# Patient Record
Sex: Female | Born: 1965 | Race: White | Hispanic: No | Marital: Married | Smoking: Current every day smoker
Health system: Southern US, Community
[De-identification: ages and names within clinical notes are randomized; demographics above are authoritative.]

## PROBLEM LIST (undated history)

## (undated) DIAGNOSIS — K589 Irritable bowel syndrome without diarrhea: Secondary | ICD-10-CM

## (undated) DIAGNOSIS — K5792 Diverticulitis of intestine, part unspecified, without perforation or abscess without bleeding: Secondary | ICD-10-CM

## (undated) DIAGNOSIS — K219 Gastro-esophageal reflux disease without esophagitis: Secondary | ICD-10-CM

## (undated) DIAGNOSIS — M1990 Unspecified osteoarthritis, unspecified site: Secondary | ICD-10-CM

## (undated) HISTORY — PX: SHOULDER SURGERY: SHX246

## (undated) HISTORY — DX: Diverticulitis of intestine, part unspecified, without perforation or abscess without bleeding: K57.92

## (undated) HISTORY — PX: FOOT SURGERY: SHX648

## (undated) HISTORY — PX: CHOLECYSTECTOMY: SHX55

## (undated) HISTORY — PX: PARTIAL HYSTERECTOMY: SHX80

## (undated) HISTORY — DX: Irritable bowel syndrome, unspecified: K58.9

## (undated) HISTORY — PX: TUBAL LIGATION: SHX77

## (undated) HISTORY — DX: Gastro-esophageal reflux disease without esophagitis: K21.9

---

## 2002-09-17 ENCOUNTER — Encounter: Payer: Self-pay | Admitting: *Deleted

## 2002-09-17 ENCOUNTER — Emergency Department (HOSPITAL_COMMUNITY): Admission: EM | Admit: 2002-09-17 | Discharge: 2002-09-17 | Payer: Self-pay | Admitting: *Deleted

## 2006-03-17 HISTORY — PX: COLONOSCOPY: SHX174

## 2006-03-27 ENCOUNTER — Ambulatory Visit: Payer: Self-pay | Admitting: Internal Medicine

## 2006-04-11 ENCOUNTER — Ambulatory Visit (HOSPITAL_COMMUNITY): Admission: RE | Admit: 2006-04-11 | Discharge: 2006-04-11 | Payer: Self-pay | Admitting: Internal Medicine

## 2006-04-11 ENCOUNTER — Ambulatory Visit: Payer: Self-pay | Admitting: Internal Medicine

## 2006-07-19 ENCOUNTER — Ambulatory Visit: Payer: Self-pay | Admitting: Internal Medicine

## 2006-08-09 ENCOUNTER — Ambulatory Visit (HOSPITAL_COMMUNITY): Admission: RE | Admit: 2006-08-09 | Discharge: 2006-08-09 | Payer: Self-pay | Admitting: Internal Medicine

## 2006-08-19 ENCOUNTER — Ambulatory Visit: Payer: Self-pay | Admitting: Internal Medicine

## 2006-09-24 ENCOUNTER — Ambulatory Visit: Payer: Self-pay | Admitting: Internal Medicine

## 2006-09-25 ENCOUNTER — Ambulatory Visit (HOSPITAL_COMMUNITY): Admission: RE | Admit: 2006-09-25 | Discharge: 2006-09-25 | Payer: Self-pay | Admitting: Internal Medicine

## 2006-11-19 ENCOUNTER — Ambulatory Visit: Payer: Self-pay | Admitting: Internal Medicine

## 2006-12-17 ENCOUNTER — Ambulatory Visit: Payer: Self-pay | Admitting: Internal Medicine

## 2006-12-25 ENCOUNTER — Ambulatory Visit: Payer: Self-pay | Admitting: Internal Medicine

## 2007-01-07 ENCOUNTER — Ambulatory Visit: Payer: Self-pay | Admitting: Internal Medicine

## 2008-03-29 ENCOUNTER — Encounter: Payer: Self-pay | Admitting: Orthopedic Surgery

## 2008-03-30 ENCOUNTER — Telehealth: Payer: Self-pay | Admitting: Orthopedic Surgery

## 2008-04-03 ENCOUNTER — Emergency Department (HOSPITAL_COMMUNITY): Admission: EM | Admit: 2008-04-03 | Discharge: 2008-04-04 | Payer: Self-pay | Admitting: Emergency Medicine

## 2008-04-05 ENCOUNTER — Encounter: Payer: Self-pay | Admitting: Orthopedic Surgery

## 2008-06-01 DIAGNOSIS — N83209 Unspecified ovarian cyst, unspecified side: Secondary | ICD-10-CM

## 2008-06-01 DIAGNOSIS — Z8719 Personal history of other diseases of the digestive system: Secondary | ICD-10-CM

## 2008-06-01 DIAGNOSIS — K589 Irritable bowel syndrome without diarrhea: Secondary | ICD-10-CM

## 2008-06-01 DIAGNOSIS — Z87898 Personal history of other specified conditions: Secondary | ICD-10-CM

## 2008-06-01 DIAGNOSIS — F341 Dysthymic disorder: Secondary | ICD-10-CM

## 2009-11-29 ENCOUNTER — Emergency Department (HOSPITAL_COMMUNITY): Admission: EM | Admit: 2009-11-29 | Discharge: 2009-11-29 | Payer: Self-pay | Admitting: Emergency Medicine

## 2009-12-07 ENCOUNTER — Encounter: Payer: Self-pay | Admitting: Physician Assistant

## 2009-12-07 ENCOUNTER — Ambulatory Visit: Payer: Self-pay | Admitting: Family Medicine

## 2009-12-07 DIAGNOSIS — R1013 Epigastric pain: Secondary | ICD-10-CM

## 2009-12-07 DIAGNOSIS — R071 Chest pain on breathing: Secondary | ICD-10-CM

## 2009-12-07 DIAGNOSIS — F17200 Nicotine dependence, unspecified, uncomplicated: Secondary | ICD-10-CM

## 2009-12-09 LAB — CONVERTED CEMR LAB
ALT: 19 units/L (ref 0–35)
AST: 17 units/L (ref 0–37)
Albumin: 4.8 g/dL (ref 3.5–5.2)
Alkaline Phosphatase: 67 units/L (ref 39–117)
BUN: 24 mg/dL — ABNORMAL HIGH (ref 6–23)
CO2: 18 meq/L — ABNORMAL LOW (ref 19–32)
Calcium: 9.9 mg/dL (ref 8.4–10.5)
Chloride: 104 meq/L (ref 96–112)
Creatinine, Ser: 1.05 mg/dL (ref 0.40–1.20)
Glucose, Bld: 76 mg/dL (ref 70–99)
HCT: 41.2 % (ref 36.0–46.0)
Helicobacter Pylori Antibody-IgG: 3.9 — ABNORMAL HIGH
Hemoglobin: 13.7 g/dL (ref 12.0–15.0)
MCHC: 33.3 g/dL (ref 30.0–36.0)
MCV: 102.7 fL — ABNORMAL HIGH (ref 78.0–100.0)
Platelets: 381 10*3/uL (ref 150–400)
Potassium: 4.7 meq/L (ref 3.5–5.3)
RBC: 4.01 M/uL (ref 3.87–5.11)
RDW: 13.6 % (ref 11.5–15.5)
Sodium: 136 meq/L (ref 135–145)
Total Bilirubin: 0.3 mg/dL (ref 0.3–1.2)
Total Protein: 8.3 g/dL (ref 6.0–8.3)
WBC: 8.9 10*3/uL (ref 4.0–10.5)

## 2009-12-13 ENCOUNTER — Telehealth: Payer: Self-pay | Admitting: Physician Assistant

## 2010-01-30 ENCOUNTER — Ambulatory Visit: Payer: Self-pay | Admitting: Family Medicine

## 2010-01-30 DIAGNOSIS — K219 Gastro-esophageal reflux disease without esophagitis: Secondary | ICD-10-CM | POA: Insufficient documentation

## 2010-01-30 DIAGNOSIS — S335XXA Sprain of ligaments of lumbar spine, initial encounter: Secondary | ICD-10-CM

## 2010-10-17 NOTE — Assessment & Plan Note (Signed)
Summary: back pain- room 2   Vital Signs:  Patient profile:   45 year old female Height:      65 inches Weight:      146.75 pounds BMI:     24.51 O2 Sat:      98 % on Room air Pulse rate:   99 / minute Resp:     16 per minute BP sitting:   110 / 78  (left arm)  Vitals Entered By: Adella Hare LPN (Jan 30, 2010 1:41 PM) CC: back pain and stomach hurts Is Patient Diabetic? No Pain Assessment Patient in pain? yes     Location: back Intensity: 8 Type: throbbing Onset of pain  Constant   CC:  back pain and stomach hurts.  History of Present Illness: Pt states her nausea & abd bloating has returned.  Omeprazole did help, but didn't know she had a refill & has run out of it.  Her syptoms have returned since she ran out.  Hurt her back 8 days ago.  Was filling the bath tub with water & when she moved/twisted had sudden pain in Rt lower back.  Hx of muscles spasms in lower back about 1 yr ago.  No radiation.  No numbess or tingling.  Has not tried any medications for this.  Current Medications (verified): 1)  None  Allergies (verified): 1)  ! Codeine 2)  ! Flagyl 3)  ! Nsaids 4)  ! Flexeril  Past History:  Past medical history reviewed for relevance to current acute and chronic problems.  Past Medical History: Diverticulitis, hx of IBS GERD  Review of Systems CV:  Denies chest pain or discomfort. Resp:  Denies shortness of breath. GI:  Complains of indigestion, loss of appetite, and nausea; denies abdominal pain, change in bowel habits, and vomiting. MS:  Complains of low back pain; denies mid back pain. Neuro:  Denies numbness, tingling, and weakness.  Physical Exam  General:  Well-developed,well-nourished,in no acute distress; alert,appropriate and cooperative throughout examination Head:  Normocephalic and atraumatic without obvious abnormalities. No apparent alopecia or balding. Lungs:  Normal respiratory effort, chest expands symmetrically. Lungs are clear  to auscultation, no crackles or wheezes. Heart:  Normal rate and regular rhythm. S1 and S2 normal without gallop, murmur, click, rub or other extra sounds. Abdomen:  Bowel sounds positive,abdomen soft and non-tender without masses, organomegaly or hernias noted. Msk:  LS Spine:  pt is noted to change positions slowly.  Full flexion present, but difficulty stand back up afterwards.  TTP bilat L3-L5 paraspinal muscles, SI joints.  Neg SLR. Pulses:  R posterior tibial normal, R dorsalis pedis normal, L posterior tibial normal, and L dorsalis pedis normal.   Extremities:  No clubbing, cyanosis, edema, or deformity noted with normal full range of motion of all joints.   Neurologic:  alert & oriented X3, sensation intact to light touch, gait normal, and DTRs symmetrical and normal.     Impression & Recommendations:  Problem # 1:  GERD (ICD-530.81) Assessment Improved  The following medications were removed from the medication list:    Omeprazole 40 Mg Cpdr (Omeprazole) .Marland Kitchen... Take 1 daily Her updated medication list for this problem includes:    Omeprazole 40 Mg Cpdr (Omeprazole) .Marland Kitchen... Take 1 daily  Problem # 2:  LUMBAR STRAIN, ACUTE (ICD-847.2) Assessment: New Instructed pt to use ice on the area 3-4 times a day. Due to her current GI problems to avoid Ibuprofen at this time.  Orders: Depo- Medrol 80mg  (J1040) Ketorolac-Toradol  15mg  (Z6109) Admin of Therapeutic Inj  intramuscular or subcutaneous (60454)  Complete Medication List: 1)  Omeprazole 40 Mg Cpdr (Omeprazole) .... Take 1 daily 2)  Tramadol Hcl 50 Mg Tabs (Tramadol hcl) .... Take 1 every 6 hrs as needed for pain 3)  Robaxin 500 Mg Tabs (Methocarbamol) .... Take 2 every 6 hrs as needed for back pain and muscle spasm  Patient Instructions: 1)  Please schedule a follow-up appointment in 3 months. 2)  Tobacco is very bad for your health and your loved ones! You Should stop smoking!. 3)  Stop Smoking Tips: Choose a Quit date. Cut  down before the Quit date. decide what you will do as a substitute when you feel the urge to smoke(gum,toothpick,exercise). 4)  I have refilled your Omeprazole. 5)  I have prescribed Tramadol and a muscle relaxer for your back. 6)  You have received Toradol for pain and Depo Medrol for inflammation in your back today. Prescriptions: ROBAXIN 500 MG TABS (METHOCARBAMOL) take 2 every 6 hrs as needed for back pain and muscle spasm  #60 x 0   Entered and Authorized by:   Esperanza Sheets PA   Signed by:   Esperanza Sheets PA on 01/30/2010   Method used:   Electronically to        [facility] (retail)       [address]       [place], [place]  [address]       Ph: [phone]       Fax: [phone]   RxID:   [id number] TRAMADOL HCL 50 MG TABS (TRAMADOL HCL) take 1 every 6 hrs as needed for pain  #40 x 0   Entered and Authorized by:   Esperanza Sheets PA   Signed by:   Esperanza Sheets PA on 01/30/2010   Method used:   Electronically to        [facility] (retail)       [address]       [place], [place]  [address]       Ph: [phone]       Fax: [phone]   RxID:   [id number] OMEPRAZOLE 40 MG CPDR (OMEPRAZOLE) take 1 daily  #30 x 5   Entered and Authorized by:   Esperanza Sheets PA   Signed by:   Esperanza Sheets PA on 01/30/2010   Method used:   Electronically to        [facility] (retail)       [address]       [place], [place]  [address]       Ph: [phone]       Fax: [phone]   RxID:   [id number]    Medication Administration  Injection # 1:    Medication: Depo- Medrol 80mg     Diagnosis: LUMBAR STRAIN, ACUTE (ICD-847.2)    Route: IM    Site: RUOQ gluteus    Exp Date: 1/12    Lot #: [id number]    Mfr: Pharmacia    Patient tolerated injection without complications    Given by: Adella Hare LPN (Jan 30, 2010 2:21 PM)  Injection # 2:    Medication: Ketorolac-Toradol 15mg     Diagnosis:  LUMBAR STRAIN, ACUTE (ICD-847.2)    Route: IM    Site: LUOQ  gluteus    Exp Date: 08/18/2011    Lot #: [id number]    Mfr: hospira    Comments: toradol 60mg  given    Patient tolerated injection without complications    Given by: Adella Hare LPN (Jan 30, 2010 2:21 PM)  Orders Added: 1)  Depo- Medrol 80mg  [J1040] 2)  Ketorolac-Toradol 15mg  [J1885] 3)  Admin of Therapeutic Inj  intramuscular or subcutaneous [96372] 4)  Est. Patient Level IV [25366]

## 2010-10-17 NOTE — Assessment & Plan Note (Signed)
Summary: new patient - room 2   Vital Signs:  Patient profile:   45 year old female Height:      65 inches Weight:      145.50 pounds BMI:     24.30 O2 Sat:      95 % on Room air Pulse rate:   101 / minute Resp:     16 per minute BP sitting:   130 / 84  (left arm)  Vitals Entered By: Adella Hare LPN (December 07, 2009 1:38 PM) CC: new patient Is Patient Diabetic? No Pain Assessment Patient in pain? yes     Location: chest Intensity: 5 Type: heaviness Onset of pain  Intermittent   CC:  new patient.  History of Present Illness: New pt here today with c/o chest pain x 4 wks. Intermittent.  worse with mvmts. Feels like a wt on her chest, sometimes sharp pains too. Occ shortness of breath, no diaphoresis. Nausea daily.  Rare heartburn.  Stomach pains x 2-3 wks. Has tried tums,etc. & no help. Was seen in the ER 3/15.  Per pt had neg CXR, EKG & labs. Rx'd Flexeril & NSAID.  States she was hallucinating with these.    Current Medications (verified): 1)  None  Allergies (verified): 1)  ! Codeine 2)  ! Flagyl 3)  ! Nsaids 4)  ! Flexeril  Past History:  Past medical, surgical, family and social histories (including risk factors) reviewed for relevance to current acute and chronic problems.  Past Medical History: Diverticulitis, hx of IBS  Past Surgical History: Tubal Ligation Partial hysterectomy Cholecystectomy  Family History: Reviewed history and no changes required. Mother deceased- migraines, aneurysm Father living- health status unknown Two brothers living- presumed healthy  Social History: Reviewed history and no changes required. Employed part time- waitress Married 21 years 3 children  Current Smoker 1 pack a day Alcohol use-no Drug use-no Regular exercise-no Smoking Status:  current Drug Use:  no Does Patient Exercise:  no  Review of Systems General:  Complains of chills; denies fever. CV:  Complains of chest pain or discomfort; denies  palpitations. Resp:  Complains of shortness of breath; denies cough. GI:  Complains of indigestion and nausea; denies vomiting. Psych:  Complains of depression; denies anxiety.  Physical Exam  General:  Well-developed,well-nourished,in no acute distress; alert,appropriate and cooperative throughout examination Head:  Normocephalic and atraumatic without obvious abnormalities. No apparent alopecia or balding. Ears:  External ear exam shows no significant lesions or deformities.  Otoscopic examination reveals clear canals, tympanic membranes are intact bilaterally without bulging, retraction, inflammation or discharge. Hearing is grossly normal bilaterally. Nose:  External nasal examination shows no deformity or inflammation. Nasal mucosa are pink and moist without lesions or exudates. Mouth:  Oral mucosa and oropharynx without lesions or exudates.  Teeth in good repair. Neck:  No deformities, masses, or tenderness noted. Chest Wall:  no deformities, no mass, chest wall tenderness, and costochondrial tenderness.   Lungs:  Normal respiratory effort, chest expands symmetrically. Lungs are clear to auscultation, no crackles or wheezes. Heart:  Normal rate and regular rhythm. S1 and S2 normal without gallop, murmur, click, rub or other extra sounds. Abdomen:  soft and no hepatomegaly.   +TTP epigastrum Cervical Nodes:  No lymphadenopathy noted Psych:  Cognition and judgment appear intact. Alert and cooperative with normal attention span and concentration. No apparent delusions, illusions, hallucinations   Impression & Recommendations:  Problem # 1:  CHEST WALL PAIN, ACUTE (PIR-518.84) Assessment New   Pt  unable to tolerate NSAIDS at this time. Rx'd Tramadol to use as needed.  Problem # 2:  ABDOMINAL PAIN, EPIGASTRIC (ICD-789.06) Assessment: New  Labs ordered. Omeprazole 40 mg daily.  Orders: T-Comprehensive Metabolic Panel [phone] [id number]) T-CBC No Diff ([id number]) T-Helicobacter AB -  IgG ([id number])  Problem # 3:  NICOTINE ADDICTION (ICD-305.1) Assessment: Comment Only  Encouraged smoking cessation   Complete Medication List: 1)  Tramadol Hcl 50 Mg Tabs (Tramadol hcl) .... Take 1 every 6 hrs as needed for pain 2)  Omeprazole 40 Mg Cpdr (Omeprazole) .... Take 1 daily  Patient Instructions: 1)  Please schedule a follow-up appointment in 1 month. 2)  Tobacco is very bad for your health and your loved ones! You Should stop smoking!. 3)  Stop Smoking Tips: Choose a Quit date. Cut down before the Quit date. decide what you will do as a substitute when you feel the urge to smoke(gum,toothpick,exercise). 4)  Have blood work drawn. 5)  Start Omeprazole 1 daily for stomach pain. 6)  Use Tramadol as needed for chest wall pain. Prescriptions: OMEPRAZOLE 40 MG CPDR (OMEPRAZOLE) take 1 daily  #30 x 1   Entered and Authorized by:   Esperanza Sheets PA   Signed by:   Esperanza Sheets PA on 12/07/2009   Method used:   Electronically to        [facility] (ret[facility])       [address]       [place], [place]  [address]       Ph: [phone]       Fax: [phone]   RxID:   [id number] TRAMADOL HCL 50 MG TABS (TRAMADOL HCL) take 1 every 6 hrs as needed for pain  #30 x 0   Entered and Authorized by:   Esperanza Sheets PA   Signed by:   Esperanza Sheets PA on 12/07/2009   Method used:   Electronically to        [facility] ([facility])       [address]       [place], [place]  [address]       Ph: [phone]       Fax: [phone]   RxID:   [id number]

## 2010-10-17 NOTE — Progress Notes (Signed)
  Phone Note Call from Patient   Summary of Call: Patient diagnosed with hpylori and last week, still hurting bad in stomach on both sides in back. States stomach is swellng some and when she's sitting up she has to lay back some because she feels like she can't breathe. also she can't seem to get enough rest. Thought she should be seeing some improvement by now, not feeling worse . Pain a 7 on a scale of 1-10.   [phone] Initial call taken by: Everitt Amber LPN,  December 13, 2009 2:29 PM  Follow-up for Phone Call        Will take at least 1 wk to notice improvement, and maybe 2. Keep taking the medication. Try eating smaller more freq meals in the meantime. Follow-up by: Esperanza Sheets PA,  December 13, 2009 3:01 PM  Additional Follow-up for Phone Call Additional follow up Details #1::        Patient aware Additional Follow-up by: Everitt Amber LPN,  December 13, 2009 3:05 PM

## 2010-12-10 LAB — DIFFERENTIAL
Basophils Absolute: 0 10*3/uL (ref 0.0–0.1)
Lymphocytes Relative: 34 % (ref 12–46)
Lymphs Abs: 2.4 10*3/uL (ref 0.7–4.0)
Monocytes Absolute: 0.3 10*3/uL (ref 0.1–1.0)
Neutro Abs: 4.4 10*3/uL (ref 1.7–7.7)

## 2010-12-10 LAB — CBC
HCT: 37.1 % (ref 36.0–46.0)
MCHC: 34.6 g/dL (ref 30.0–36.0)
MCV: 101.3 fL — ABNORMAL HIGH (ref 78.0–100.0)
Platelets: 314 10*3/uL (ref 150–400)
RDW: 13.6 % (ref 11.5–15.5)

## 2010-12-10 LAB — COMPREHENSIVE METABOLIC PANEL
AST: 26 U/L (ref 0–37)
Albumin: 3.9 g/dL (ref 3.5–5.2)
BUN: 12 mg/dL (ref 6–23)
Calcium: 9.6 mg/dL (ref 8.4–10.5)
Chloride: 100 mEq/L (ref 96–112)
Creatinine, Ser: 0.49 mg/dL (ref 0.4–1.2)
GFR calc Af Amer: >60 mL/min (ref >60)
Total Bilirubin: 0.2 mg/dL — ABNORMAL LOW (ref 0.3–1.2)
Total Protein: 7.2 g/dL (ref 6.0–8.3)

## 2010-12-10 LAB — POCT CARDIAC MARKERS
CKMB, poc: <1 ng/mL — ABNORMAL LOW (ref 1.0–8.0)
Troponin i, poc: <0.05 ng/mL (ref 0.00–0.09)

## 2011-02-02 NOTE — H&P (Signed)
NAMEZAYNE, MAROVICH                 ACCOUNT NO.:  [id number]   MEDICAL RECORD NO.:  [id number]          PATIENT TYPE:   LOCATION:                                 FACILITY:   PHYSICIAN:  R. Roetta Sessions, M.D. DATE OF BIRTH:  03/12/1966   DATE OF ADMISSION:  03/28/2006  DATE OF DISCHARGE:  LH                                HISTORY & PHYSICAL   CHIEF COMPLAINT:  Diverticulitis.   HISTORY OF PRESENT ILLNESS:  Ms. Freedman is a 45 year old Caucasian female,  who presents with over a one year history of intermittent left lower  quadrant pain.  About 2 months ago she was diagnosed with diverticulitis.  She was hospitalized twice. She reports she was treated with 2 rounds of  antibiotics.  She states she continues to have intermittent left lower  quadrant pain, as well as loose stools up to 5 times a day.  She states the  pain is 5/10 on pain scale, and constant at times.  She denies any fever or  chills.  Denies any nausea or vomiting.  She has noticed one episode of  scant hematochezia with wiping on the toilet paper.  She takes Motrin 400 mg  about 3-4 times a week.   PAST MEDICAL HISTORY:  1.  Diverticulitis.  2.  Ovarian cysts.  3.  Tubal ligation.  4.  Partial hysterectomy.   CURRENT MEDICATIONS:  Motrin 400 mg daily p.r.n.   ALLERGIES:  CODEINE.   FAMILY HISTORY:  Positive for maternal aunt diagnosed with colon cancer at  age 26.  No first-degree relatives.  She does not know her father's history.  Her mother deceased at age 11, secondary to an aneurysm.  She has 2 healthy  brothers.   SOCIAL HISTORY:  Ms. Gallego has been married for 17 years.  She has 12-year-  old, 43 year old and 59 year old children who are healthy.  She is employed  full time at [employer] in [place].  She denies any tobacco or alcohol drug  use.   REVIEW OF SYSTEMS:  CONSTITUTIONAL:  She has lost 13 pounds in the last  year.  She is complaining of fatigue.  CARDIOVASCULAR:  Denies chest pain or  palpitations.  PULMONARY:  Denies shortness of breath, dyspnea, cough or  hemoptysis.  GI:  See HPI.  Denies any heartburn, indigestion, dysphagia or  odynophagia.  Denies any anorexia or early satiety.   PHYSICAL EXAMINATION:  VITAL SIGNS:  Weight 122 pounds, height 65-1/2  inches, temperature 98.4, blood pressure 122/70, pulse 64.  GENERAL:  Ms. Arts is a 45 year old Caucasian female, who is alert,  oriented, pleasant and cooperative -- in no acute distress.  HEENT:  Sclerae clear.  EOMI.  PERRL.  Oropharynx clear and moist without  any lesions.  NECK:  Supple without any adenopathy or thyromegaly.  CHEST:  Heart regular rate and rhythm; normal S1 and S2.  No murmurs, rubs  or gallops.  LUNGS:  Clear to auscultation bilaterally.  ABDOMEN:  Positive bowel sounds x4.  No bruits auscultated.  Nondistended.  Soft.  She does have palpable left  lower quadrant tenderness, which is mild.  There is no rebound tenderness or guarding.  No hepatosplenomegaly or mass.  EXTREMITIES:  Without clubbing or edema bilaterally.  SKIN:  Pink, warm and dry without any rash or jaundice.   ASSESSMENT:  Ms. Cly is a 45 year old Caucasian family, with a history of  diverticulitis; which initially began about 2 months ago.  She has been  through 2 courses of antibiotics, and admitted to [facility]  twice (per her report).  Apparently she has had at least 2 CT scans and an  MRI done through [facility].  Unfortunately, I do not have these reports to  review.  We are going to try and obtain these.   RECOMMENDATIONS:  She is to proceed with colonoscopy, once I have reviewed  CT scans.  Follow-up on this diverticulitis to rule out occult mass and  inflammatory or bowel disease.   PLAN:  1.  Will review CT scans and MRI.  2.  Will schedule colonoscopy with Dr. Jonathon Bellows in the near future.   I have discussed the procedure, including the risks and benefits --  including, but not limited  to, bleeding, infection, perforation, drug  reaction.  She agrees and willing to accept.  Consent to be obtained.      Nicholas Lose, N.P.      Jonathon Bellows, M.D.  Electronically Signed    KC/MEDQ  D:  03/28/2006  T:  03/28/2006  Job:  [id number]

## 2011-02-02 NOTE — Op Note (Signed)
Melissa Fowler, Melissa Fowler                 ACCOUNT NO.:  [id number]   MEDICAL RECORD NO.:  [id number]          PATIENT TYPE:  AMB   LOCATION:  DAY                           FACILITY:  [facility]   PHYSICIAN:  R. Roetta Sessions, M.D. DATE OF BIRTH:  1966-08-03   DATE OF PROCEDURE:  04/11/2006  DATE OF DISCHARGE:                                 OPERATIVE REPORT   PROCEDURE PERFORMED:  Colonoscopy, diagnostic.   INDICATIONS FOR PROCEDURE:  The patient is a 45 year old lady with reported  recurrent diverticulitis, although I do not have any of the prior CT reports  for review.  She had a CT scan done on April 09, 2006 at [facility] which  demonstrated no evidence of diverticulitis, although she had diverticulosis.  She had bilateral adnexal cysts.  I just received reports from [facility] from May of 2007 which revealed some inflammatory changes and  thickening of the descending colon consistent with acute diverticulitis with  some thickening of the colonic wall.  [name] abdominal pain has  resolved with antibiotics.  Colonoscopy is now being done.  She has passed a  scant amount of blood per rectum on occasion.  This approach has been  discussed with the patient.  The potential risks, benefits, and alternatives  have been reviewed, questions answered, and she is agreeable.  Please see  documentation on the medical record.   PROCEDURE NOTE:  The O2 saturation, blood pressure, and pulse on this  patient were monitored throughout the entire procedure.   CONSCIOUS SEDATION:  Demerol 100 mg IV, Versed 8 mg IV in divided doses.   INSTRUMENT:  Olympus video chip system pediatric scope.   FINDINGS:  Digital rectal exam revealed no abnormalities.   ENDOSCOPIC FINDINGS:  Prep was good.   RECTUM:  Examination of the rectal mucosa demonstrated minimally friable  anal canal.  Rectal vault was small, so I was unable retroflex, but for some  reason, I am able to see the rectal mucosa fairly well.  __________.   COLON:  Colonic mucosa was surveyed from the rectosigmoid junction through  the left, transverse, right colon to the appendiceal orifice, ileocecal  valve, and cecum.  Terminal ileum was intubated to 10 cm.  From this level,  the scope was slowly withdrawn.  All previously mentioned mucosal surfaces  were again seen.  The patient had extensive left-sided and transverse  diverticula.  The patient had a tight colon and a small pelvis, and portions  of the intrapelvic colon were not compliant, requiring a number of maneuvers  including external abdominal pressure and changing of the patient's position  to reach the cecum.  Aside from left-sided and transverse diverticula, the  colonic mucosa and terminal ileum mucosa appeared entirely normal.  The  patient tolerated the procedure well and was reacting in Endoscopy.   IMPRESSION:  Minimally friable anal canal.  Otherwise, normal rectum.  Fairly extensive left-sided and transverse diverticula.  Her colonic mucosa  otherwise appeared normal.  The terminal ileum appeared normal.   RECOMMENDATIONS:  We will begin b.i.d. fiber supplement in the  way of  Benefiber.  I plan to see this lady back in three months.  She reportedly  has now had multiple attacks of diverticulitis.   It is possible she may need surgical therapy at some point in the future.  We will see how she does over the next three months and go from there.      Jonathon Bellows, M.D.  Electronically Signed     RMR/MEDQ  D:  04/11/2006  T:  04/11/2006  Job:  [id number]   cc:   Kirstie Peri, MD  Fax: [phone]

## 2011-05-28 ENCOUNTER — Ambulatory Visit (INDEPENDENT_AMBULATORY_CARE_PROVIDER_SITE_OTHER): Payer: 59 | Admitting: Gastroenterology

## 2011-05-28 ENCOUNTER — Encounter: Payer: Self-pay | Admitting: Gastroenterology

## 2011-05-28 VITALS — BP 127/89 | HR 83 | Temp 98.2°F | Ht 65.0 in | Wt 164.4 lb

## 2011-05-28 DIAGNOSIS — K625 Hemorrhage of anus and rectum: Secondary | ICD-10-CM

## 2011-05-28 DIAGNOSIS — R197 Diarrhea, unspecified: Secondary | ICD-10-CM

## 2011-05-28 DIAGNOSIS — K529 Noninfective gastroenteritis and colitis, unspecified: Secondary | ICD-10-CM | POA: Insufficient documentation

## 2011-05-28 DIAGNOSIS — K589 Irritable bowel syndrome without diarrhea: Secondary | ICD-10-CM

## 2011-05-28 DIAGNOSIS — R635 Abnormal weight gain: Secondary | ICD-10-CM | POA: Insufficient documentation

## 2011-05-28 MED ORDER — DICYCLOMINE HCL 10 MG PO CAPS: 10.0000 mg | ORAL_CAPSULE | Freq: Four times a day (QID) | ORAL | Status: DC | PRN

## 2011-05-28 NOTE — Assessment & Plan Note (Signed)
TCS as planned.  

## 2011-05-28 NOTE — Assessment & Plan Note (Addendum)
Worsening of diarrhea, intractable symptoms. Imodium provides short-term relief. Ten plus stools daily. No weight loss. Rare nocturnal symptoms. Suspect IBS but need to exclude IBD, microscopic colitis. FH CRC second degree relative only.  Check labs. Start bentyl.   TCS/TI/random bx in near future.  I have discussed the risks, alternatives, benefits with regards to but not limited to the risk of reaction to medication, bleeding, infection, perforation and the patient is agreeable to proceed. Written consent to be obtained.

## 2011-05-28 NOTE — Progress Notes (Signed)
Primary Care Physician:  States she does not have PCP.  Primary Gastroenterologist:  Roetta Sessions, MD   Chief Complaint  Patient presents with  . Diarrhea    HPI:  Melissa Fowler is a 45 y.o. female here for further evaluation of intractable diarrhea. Last seen in 2008. 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 Used to have mostly constipation. In 2007-2008, she had negative SBFT, celiac labs. TCS/TI showed diverticulosis/hemorrhoids. She has documented diverticulitis in 2007. She failed questran for ?bile acid diarrhea post cholecystectomy. She failed levsin.  Patient states her symptoms are worse now then back in 2008. Fecal urgency with fecal incontinence. Abd swelling gets worse with the day. Postprandial stools. Feels pregnant. Pass stool up to ten times daily. Passes stool with urination and flatulence. Stools are very soft to watery. Some rectal bleeding twice last month. Cannot remember the last time she had solid stool. Rare nocturnal diarrhea. No weight loss. Weight up over 35 pounds since 2008. No PCP. No recent labs or physical. No heartburn, vomiting, dysphagia. Appetite not as good. No recent antibiotics, travel abroad, ill contacts, medication changes.   Current Outpatient Prescriptions  Medication Sig Dispense Refill  . UNABLE TO FIND OTC sleeping pill PRN       . dicyclomine (BENTYL) 10 MG capsule Take 1 capsule (10 mg total) by mouth 4 (four) times daily as needed.  120 capsule  1    Allergies as of 05/28/2011 - Review Complete 05/28/2011  Allergen Reaction Noted  . Codeine    . Cyclobenzaprine hcl  12/07/2009  . Metronidazole    . Nsaids  12/07/2009    Past Medical History  Diagnosis Date  . Diverticulitis   . Irritable bowel syndrome   . GERD (gastroesophageal reflux disease)     Past Surgical History  Procedure Date  . Tubal ligation   . Cholecystectomy   . Partial hysterectomy   . Colonoscopy 03/2006    Minimal friable anal canal, fairly extensive  left-sided and transverse diverticula, normal terminal ileum  . Foot surgery 2011/2012    bilateral    Family History  Problem Relation Age of Onset  . Colon cancer Maternal Aunt 48  . Aneurysm Mother 101    Deceased, brain    History   Social History  . Marital Status: Married    Spouse Name: N/A    Number of Children: 3  . Years of Education: N/A   Occupational History  . restaurant cook     part-time   Social History Main Topics  . Smoking status: Never Smoker   . Smokeless tobacco: Not on file  . Alcohol Use: Yes     very rare  . Drug Use: No  . Sexually Active: Not on file   Other Topics Concern  . Not on file   Social History Narrative  . No narrative on file      ROS:  General: Negative for anorexia, weight loss, fever, chills, fatigue, weakness. Eyes: Negative for vision changes.  ENT: Negative for hoarseness, difficulty swallowing , nasal congestion. CV: Negative for chest pain, angina, palpitations, dyspnea on exertion, peripheral edema.  Respiratory: Negative for dyspnea at rest, dyspnea on exertion, cough, sputum, wheezing.  GI: See history of present illness. GU:  Negative for dysuria, hematuria, urinary incontinence, urinary frequency, nocturnal urination.  MS: Negative for joint pain, low back pain.  Derm: Negative for rash or itching.  Neuro: Negative for weakness, abnormal sensation, seizure, frequent headaches, memory loss, confusion.  Psych:  Negative for anxiety, depression, suicidal ideation, hallucinations.  Endo: Negative for unusual weight change.  Heme: Negative for bruising or bleeding. Allergy: Negative for rash or hives.    Physical Examination:  BP 127/89  Pulse 83  Temp(Src) 98.2 F (36.8 C) (Temporal)  Ht 5\' 5"  (1.651 m)  Wt 164 lb 6.4 oz (74.571 kg)  BMI 27.36 kg/m2   General: Well-nourished, well-developed in no acute distress.  Head: Normocephalic, atraumatic.   Eyes: Conjunctiva pink, no icterus. Mouth:  Oropharyngeal mucosa moist and pink , no lesions erythema or exudate. Neck: Supple without thyromegaly, masses, or lymphadenopathy.  Lungs: Clear to auscultation bilaterally.  Heart: Regular rate and rhythm, no murmurs rubs or gallops.  Abdomen: Bowel sounds are normal, nontender, nondistended, no hepatosplenomegaly or masses, no abdominal bruits or    hernia , no rebound or guarding.   Rectal: Deferred to time of colonoscopy. Extremities: No lower extremity edema. No clubbing or deformities.  Neuro: Alert and oriented x 4 , grossly normal neurologically.  Skin: Warm and dry, no rash or jaundice.   Psych: Alert and cooperative, normal mood and affect.

## 2011-05-29 LAB — COMPREHENSIVE METABOLIC PANEL
ALT: 11 U/L (ref 0–35)
AST: 15 U/L (ref 0–37)
Albumin: 4 g/dL (ref 3.5–5.2)
Alkaline Phosphatase: 59 U/L (ref 39–117)
Potassium: 4.1 mEq/L (ref 3.5–5.3)
Sodium: 138 mEq/L (ref 135–145)
Total Protein: 7.1 g/dL (ref 6.0–8.3)

## 2011-05-29 LAB — CBC WITH DIFFERENTIAL/PLATELET
Basophils Absolute: 0.1 10*3/uL (ref 0.0–0.1)
Basophils Relative: 1 % (ref 0–1)
Hemoglobin: 13.5 g/dL (ref 12.0–15.0)
Lymphocytes Relative: 34 % (ref 12–46)
MCHC: 32.5 g/dL (ref 30.0–36.0)
Neutro Abs: 6.2 10*3/uL (ref 1.7–7.7)
Neutrophils Relative %: 59 % (ref 43–77)
RDW: 13.7 % (ref 11.5–15.5)
WBC: 10.5 10*3/uL (ref 4.0–10.5)

## 2011-05-29 NOTE — Progress Notes (Signed)
No PCP on file 

## 2011-05-30 NOTE — Progress Notes (Signed)
Quick Note:  Labs good. TCS as planned. ______

## 2011-06-05 ENCOUNTER — Encounter (HOSPITAL_COMMUNITY)
Admission: RE | Disposition: A | Discharge status: Home / Self Care | Payer: Self-pay | Source: Ambulatory Visit | Attending: Internal Medicine

## 2011-06-05 ENCOUNTER — Ambulatory Visit (HOSPITAL_COMMUNITY)
Admission: RE | Admit: 2011-06-05 | Discharge: 2011-06-05 | Disposition: A | Discharge status: Home / Self Care | Payer: 59 | Source: Ambulatory Visit | Attending: Internal Medicine | Admitting: Internal Medicine

## 2011-06-05 ENCOUNTER — Other Ambulatory Visit: Payer: Self-pay | Admitting: Internal Medicine

## 2011-06-05 DIAGNOSIS — K5730 Diverticulosis of large intestine without perforation or abscess without bleeding: Secondary | ICD-10-CM | POA: Insufficient documentation

## 2011-06-05 DIAGNOSIS — R197 Diarrhea, unspecified: Secondary | ICD-10-CM | POA: Insufficient documentation

## 2011-06-05 HISTORY — PX: COLONOSCOPY: SHX5424

## 2011-06-05 SURGERY — COLONOSCOPY
Anesthesia: Moderate Sedation

## 2011-06-05 MED ORDER — MEPERIDINE HCL 100 MG/ML IJ SOLN
INTRAMUSCULAR | Status: AC
  Filled 2011-06-05: qty 2

## 2011-06-05 MED ORDER — MEPERIDINE HCL 100 MG/ML IJ SOLN
INTRAMUSCULAR | Status: DC | PRN
  Administered 2011-06-05: 50 mg via INTRAVENOUS

## 2011-06-05 MED ORDER — MIDAZOLAM HCL 5 MG/5ML IJ SOLN
INTRAMUSCULAR | Status: AC
  Filled 2011-06-05: qty 10

## 2011-06-05 MED ORDER — STERILE WATER FOR IRRIGATION IR SOLN
Status: DC | PRN
  Administered 2011-06-05: 10:00:00

## 2011-06-05 MED ORDER — MIDAZOLAM HCL 5 MG/5ML IJ SOLN
INTRAMUSCULAR | Status: DC | PRN
  Administered 2011-06-05: 2 mg via INTRAVENOUS

## 2011-06-05 NOTE — H&P (Signed)
Tana Coast, PA  05/28/2011  8:52 PM  Signed Primary Care Physician:  States she does not have PCP.   Primary Gastroenterologist:  Roetta Sessions, MD      Chief Complaint   Patient presents with   .  Diarrhea      HPI:  Melissa Fowler is a 45 y.o. female here for further evaluation of intractable diarrhea. Last seen in 2008. 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 Used to have mostly constipation. In 2007-2008, she had negative SBFT, celiac labs. TCS/TI showed diverticulosis/hemorrhoids. She has documented diverticulitis in 2007. She failed questran for ?bile acid diarrhea post cholecystectomy. She failed levsin.   Patient states her symptoms are worse now then back in 2008. Fecal urgency with fecal incontinence. Abd swelling gets worse with the day. Postprandial stools. Feels pregnant. Pass stool up to ten times daily. Passes stool with urination and flatulence. Stools are very soft to watery. Some rectal bleeding twice last month. Cannot remember the last time she had solid stool. Rare nocturnal diarrhea. No weight loss. Weight up over 35 pounds since 2008. No PCP. No recent labs or physical. No heartburn, vomiting, dysphagia. Appetite not as good. No recent antibiotics, travel abroad, ill contacts, medication changes.      Current Outpatient Prescriptions   Medication  Sig  Dispense  Refill   .  UNABLE TO FIND  OTC sleeping pill PRN          .  dicyclomine (BENTYL) 10 MG capsule  Take 1 capsule (10 mg total) by mouth 4 (four) times daily as needed.   120 capsule   1       Allergies as of 05/28/2011 - Review Complete 05/28/2011   Allergen  Reaction  Noted   .  Codeine       .  Cyclobenzaprine hcl    12/07/2009   .  Metronidazole       .  Nsaids    12/07/2009       Past Medical History   Diagnosis  Date   .  Diverticulitis     .  Irritable bowel syndrome     .  GERD (gastroesophageal reflux disease)         Past Surgical History   Procedure  Date   .  Tubal ligation     .   Cholecystectomy     .  Partial hysterectomy     .  Colonoscopy  03/2006       Minimal friable anal canal, fairly extensive left-sided and transverse diverticula, normal terminal ileum   .  Foot surgery  2011/2012       bilateral       Family History   Problem  Relation  Age of Onset   .  Colon cancer  Maternal Aunt  48   .  Aneurysm  Mother  54       Deceased, brain       History       Social History   .  Marital Status:  Married       Spouse Name:  N/A       Number of Children:  3   .  Years of Education:  N/A       Occupational History   .  restaurant cook         part-time       Social History Main Topics   .  Smoking status:  Never Smoker    .  Smokeless tobacco:  Not on file   .  Alcohol Use:  Yes         very rare   .  Drug Use:  No   .  Sexually Active:  Not on file       Other Topics  Concern   .  Not on file       Social History Narrative   .  No narrative on file        ROS:   General: Negative for anorexia, weight loss, fever, chills, fatigue, weakness. Eyes: Negative for vision changes.   ENT: Negative for hoarseness, difficulty swallowing , nasal congestion. CV: Negative for chest pain, angina, palpitations, dyspnea on exertion, peripheral edema.   Respiratory: Negative for dyspnea at rest, dyspnea on exertion, cough, sputum, wheezing.   GI: See history of present illness. GU:  Negative for dysuria, hematuria, urinary incontinence, urinary frequency, nocturnal urination.   MS: Negative for joint pain, low back pain.   Derm: Negative for rash or itching.   Neuro: Negative for weakness, abnormal sensation, seizure, frequent headaches, memory loss, confusion.   Psych: Negative for anxiety, depression, suicidal ideation, hallucinations.   Endo: Negative for unusual weight change.   Heme: Negative for bruising or bleeding. Allergy: Negative for rash or hives.     Physical Examination:   BP 127/89  Pulse 83  Temp(Src) 98.2 F (36.8 C)  (Temporal)  Ht 5\' 5"  (1.651 m)  Wt 164 lb 6.4 oz (74.571 kg)  BMI 27.36 kg/m2    General: Well-nourished, well-developed in no acute distress.   Head: Normocephalic, atraumatic.    Eyes: Conjunctiva pink, no icterus. Mouth: Oropharyngeal mucosa moist and pink , no lesions erythema or exudate. Neck: Supple without thyromegaly, masses, or lymphadenopathy.   Lungs: Clear to auscultation bilaterally.   Heart: Regular rate and rhythm, no murmurs rubs or gallops.   Abdomen: Bowel sounds are normal, nontender, nondistended, no hepatosplenomegaly or masses, no abdominal bruits or    hernia , no rebound or guarding.    Rectal: Deferred to time of colonoscopy. Extremities: No lower extremity edema. No clubbing or deformities.   Neuro: Alert and oriented x 4 , grossly normal neurologically.   Skin: Warm and dry, no rash or jaundice.    Psych: Alert and cooperative, normal mood and affect.       Glendora Score  05/29/2011  8:53 AM  Signed No PCP on file  Tana Coast, PA  05/30/2011  9:20 PM  Signed Quick Note:   Labs good. TCS as planned. ______        Chronic diarrhea - Tana Coast, PA  05/28/2011  8:55 PM  Addendum Worsening of diarrhea, intractable symptoms. Imodium provides short-term relief. Ten plus stools daily. No weight loss. Rare nocturnal symptoms. Suspect IBS but need to exclude IBD, microscopic colitis. FH CRC second degree relative only.   Check labs. Start bentyl.    TCS/TI/random bx in near future.  I have discussed the risks, alternatives, benefits with regards to but not limited to the risk of reaction to medication, bleeding, infection, perforation and the patient is agreeable to proceed. Written consent to be obtained.       I have seen the patient prior to the procedure(s) today and reviewed the history and physical / consultation from 05/28/11.  There have been no changes. After consideration of the risks, benefits, alternatives and imponderables, the patient has  consented to the procedure(s).

## 2011-06-05 NOTE — Op Note (Signed)
NAMECARRI, SPILLERS                 ACCOUNT NO.:  [id number]  MEDICAL RECORD NO.:  [id number]  LOCATION:  [facility]                          FACILITY:  [facility]  PHYSICIAN:  R. Roetta Sessions, MD FACP FACGDATE OF BIRTH:  [birth year]  DATE OF PROCEDURE:  06/05/2011 DATE OF DISCHARGE:                              OPERATIVE REPORT   PROCEDURE:  Ileal colonoscopy with segmental biopsy.  SURGEON:  R. Roetta Sessions, MD FACP [facility]  INDICATIONS FOR PROCEDURE:  This is a 45 year old lady here for diagnostic colonoscopy, 38-month history of incessant nonbloody diarrhea. Colonoscopy is now being done.  Risks, benefits, limitations, alternatives, imponderables have been reviewed, and questions answered. Please see the documentation medical record.  PROCEDURE NOTE:  O2 saturation, blood pressure, pulse, respirations were monitored throughout the entirety of the procedure.  CONSCIOUS SEDATION:  Versed 8 mg IV, Demerol 125 mg IV in divided doses.  INSTRUMENT:  Pentax video chip system.  FINDINGS:  Digital rectal exam revealed no abnormalities.  FINDINGS:  Prep was adequate.  Colon:  Colonic mucosa was surveyed from the rectosigmoid junction through the left transverse right colon to the appendiceal orifice, ileocecal valve/cecum.  Terminal ileum was intubated to 10 cm.  These structures were well seen and photographed for record.  However, because we had major problems with the server today and problems with EndoPro and Epic.  These photographs were entered into EndoPro under another patient Loreli Dollar).  From this level, the scope was slowly and cautiously withdrawn.  All previously mentioned mucosal surfaces were again seen.  The patient had scattered left-sided (descending and sigmoid) diverticula.  However, the remaining colonic mucosa appeared entirely normal.  Biopsies of the ascending and descending segments were taken to rule out microscopic colitis.  Also, a stool sample was submitted to  the lab.  The distal 10-cm terminal ileum appeared entirely normal.  Scope was pulled down the rectum where thorough examination of the rectal mucosa including retroflexed view of the anal verge demonstrated only anal papilla.  Biopsies of rectal mucosa were also taken.  The patient tolerated the procedure well. Cecal withdrawal time 11 minutes.  IMPRESSION: 1. Anal papilla, otherwise normal rectum. 2. Left-sided diverticula, colonic mucosa appeared normal, status post     segmental biopsy, normal terminal ileum.  RECOMMENDATIONS: 1. Follow up on path.  Diverticulosis literature provided to Ms.     Alona Bene. 2. Further recommendations to follow.     Jonathon Bellows, MD FACP [facility]     RMR/MEDQ  D:  06/05/2011  T:  06/05/2011  Job:  [id number]

## 2011-06-06 LAB — FECAL LACTOFERRIN, QUANT

## 2011-06-07 LAB — OVA AND PARASITE EXAMINATION: Ova and parasites: NONE SEEN

## 2011-06-09 LAB — STOOL CULTURE

## 2011-06-11 ENCOUNTER — Encounter: Payer: Self-pay | Admitting: Internal Medicine

## 2011-06-11 NOTE — Progress Notes (Signed)
  bx's negative for cancer or colitis. Stool also negative.  Need ov w extender 3-4 weeks.  Please let her know Send copy of letter with path to referring provider and PCP.

## 2011-06-13 NOTE — Progress Notes (Signed)
pts husband to have her call back.

## 2011-06-13 NOTE — Progress Notes (Signed)
Pt aware, please schedule ov and cc pcp

## 2011-06-13 NOTE — Progress Notes (Signed)
Pt aware of OV for 10/17 at 0900 with KJ

## 2011-06-13 NOTE — Progress Notes (Signed)
Pt f/u w/KJ on 10/17 an she is aware

## 2011-06-14 ENCOUNTER — Encounter (HOSPITAL_COMMUNITY): Payer: Self-pay | Admitting: Internal Medicine

## 2011-07-04 ENCOUNTER — Encounter: Payer: Self-pay | Admitting: Urgent Care

## 2011-07-04 ENCOUNTER — Ambulatory Visit (INDEPENDENT_AMBULATORY_CARE_PROVIDER_SITE_OTHER): Payer: 59 | Admitting: Urgent Care

## 2011-07-04 DIAGNOSIS — R197 Diarrhea, unspecified: Secondary | ICD-10-CM

## 2011-07-04 DIAGNOSIS — K589 Irritable bowel syndrome without diarrhea: Secondary | ICD-10-CM

## 2011-07-04 DIAGNOSIS — K529 Noninfective gastroenteritis and colitis, unspecified: Secondary | ICD-10-CM

## 2011-07-04 NOTE — Patient Instructions (Addendum)
Please call & set up a Primary care provider so you can have a GYN exam to check your ovaries, get mammograms, etc Begin ALIGN once daily Begin benefiber daily Return stool to the lab ASAP Keep hydrogen breath test as planned

## 2011-07-04 NOTE — Progress Notes (Signed)
No PCP on file 

## 2011-07-04 NOTE — Assessment & Plan Note (Signed)
I suspect chronic diarrhea is most likely due to irritable bowel syndrome. Previous stool studies were benign. The previous colonoscopy with random biopsies was benign. Normal small bowel follow-through several years ago. Small bowel bacterial overgrowth remains in the differential.  Hydrogen breath test.

## 2011-07-04 NOTE — Progress Notes (Signed)
REVIEWED. AGREE. 

## 2011-07-04 NOTE — Progress Notes (Signed)
Primary Care Physician:  n/a Primary Gastroenterologist:  Dr. Jena Gauss  Chief Complaint  Patient presents with  . Follow-up    feeling the same   HPI:  Melissa Fowler is a 45 y.o. female here for follow up for IBS-D.  Tried imodium, bentyl, fiber.  C/o every day diarrhea almost 15-20 times per day.   Within 30 minutes has to have loose BM.  C/o mucus, no rectal bleeding or melena.  Denies fever or chills.  C/o anorexia.  Recent full set of stool studies benign.  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  C/o profound bloating, not relieved w/ diarrhea.  Avoids dairy.  No abdominal pain.  Denies nausea or vomiting.  Wt stable.  Previous work-up benign including SBFT & colonoscopy w/ random bx.  Past Medical History  Diagnosis Date  . Diverticulitis   . Irritable bowel syndrome   . GERD (gastroesophageal reflux disease)     Past Surgical History  Procedure Date  . Tubal ligation   . Cholecystectomy   . Partial hysterectomy   . Colonoscopy 03/2006    Minimal friable anal canal, fairly extensive left-sided and transverse diverticula, normal terminal ileum  . Foot surgery 2011/2012    bilateral  . Colonoscopy 06/05/2011    Dr Marlowe Alt papilla, diverticulosis, bening random bx    Current Outpatient Prescriptions  Medication Sig Dispense Refill  . diphenhydramine-acetaminophen (TYLENOL PM) 25-500 MG TABS Take 1 tablet by mouth at bedtime as needed. For sleep       . UNABLE TO FIND OTC sleeping pill PRN         Allergies as of 07/04/2011 - Review Complete 07/04/2011  Allergen Reaction Noted  . Codeine Itching   . Cyclobenzaprine hcl  12/07/2009  . Metronidazole    . Nsaids  12/07/2009    Review of Systems: Gen: See history of present illness. CV: Denies chest pain, angina, palpitations, syncope, orthopnea, PND, peripheral edema, and claudication. Resp: Denies dyspnea at rest, dyspnea with exercise, cough, sputum, wheezing, coughing up blood, and  pleurisy. GI: Denies vomiting blood, jaundice, and fecal incontinence.   Denies dysphagia or odynophagia. Derm: Denies rash, itching, dry skin, hives, moles, warts, or unhealing ulcers.  Psych: Denies depression, anxiety, memory loss, suicidal ideation, hallucinations, paranoia, and confusion. Heme: Denies bruising, bleeding, and enlarged lymph nodes.  Physical Exam: BP 113/83  Pulse 85  Temp(Src) 98.9 F (37.2 C) (Temporal)  Ht 5\' 5"  (1.651 m)  Wt 165 lb 3.2 oz (74.934 kg)  BMI 27.49 kg/m2 General:   Alert,  Well-developed, well-nourished, pleasant and cooperative in NAD Head:  Normocephalic and atraumatic. Eyes:  Sclera clear, no icterus.   Conjunctiva pink. Mouth:  No deformity or lesions, oropharynx pink and moist. Neck:  Supple; no masses or thyromegaly. Heart:  Regular rate and rhythm; no murmurs, clicks, rubs,  or gallops. Abdomen:  Soft, nontender and nondistended. No masses, hepatosplenomegaly or hernias noted. Normal bowel sounds, without guarding, and without rebound.   Msk:  Symmetrical without gross deformities. Normal posture. Pulses:  Normal pulses noted. Extremities:  Without clubbing or edema. Neurologic:  Alert and  oriented x4;  grossly normal neurologically. Skin:  Intact without significant lesions or rashes. Cervical Nodes:  No significant cervical adenopathy. Psych:  Alert and cooperative. Normal mood and affect.

## 2011-07-04 NOTE — Assessment & Plan Note (Signed)
Failed anti-spasmodics.  Refractory. Significant bloating.  Please call & set up a Primary care provider so you can have a GYN exam to check your ovaries, get mammograms, etc Begin ALIGN once daily Begin benefiber daily Return stool to the lab ASAP to check for Giardia

## 2011-07-09 ENCOUNTER — Telehealth: Payer: Self-pay | Admitting: Urgent Care

## 2011-07-09 NOTE — Telephone Encounter (Signed)
Please call patient to see it she has turned in her sternum specimen. Thanks

## 2011-07-09 NOTE — Telephone Encounter (Signed)
LM for pt to call

## 2011-07-09 NOTE — Telephone Encounter (Signed)
Pt called and said she has not turned in the stool specimen yet. She said when she has to go, she has to so quickly she has been unable to get specimen. She will try to get it done soon.

## 2011-07-11 ENCOUNTER — Encounter (HOSPITAL_COMMUNITY)
Admission: RE | Disposition: A | Discharge status: Home / Self Care | Payer: Self-pay | Source: Ambulatory Visit | Attending: Internal Medicine

## 2011-07-11 ENCOUNTER — Ambulatory Visit (HOSPITAL_COMMUNITY)
Admission: RE | Admit: 2011-07-11 | Discharge: 2011-07-11 | Disposition: A | Discharge status: Home / Self Care | Payer: 59 | Source: Ambulatory Visit | Attending: Internal Medicine | Admitting: Internal Medicine

## 2011-07-11 DIAGNOSIS — R197 Diarrhea, unspecified: Secondary | ICD-10-CM | POA: Insufficient documentation

## 2011-07-11 DIAGNOSIS — R141 Gas pain: Secondary | ICD-10-CM

## 2011-07-11 DIAGNOSIS — R142 Eructation: Secondary | ICD-10-CM

## 2011-07-11 DIAGNOSIS — K589 Irritable bowel syndrome without diarrhea: Secondary | ICD-10-CM

## 2011-07-11 DIAGNOSIS — R143 Flatulence: Secondary | ICD-10-CM

## 2011-07-11 HISTORY — PX: HYDROGEN BREATH TEST: SHX5529

## 2011-07-11 SURGERY — HYDROGEN BREATH TEST
Anesthesia: LOCAL

## 2011-07-11 MED ORDER — LACTULOSE 10 GM/15ML PO SOLN
ORAL | Status: AC
  Administered 2011-07-11: 37.5 g via ORAL
  Filled 2011-07-11: qty 60

## 2011-07-11 NOTE — Progress Notes (Signed)
No beans, bran or high fiber cereal the day before the procedure? no NPO except for water 12 hours before procedure? yes No smoking, sleeping or vigorous exercising for at least 30 before procedure? no Recent antibiotic use and/or diarrhea? no   If yes, physician notified.  

## 2011-07-13 ENCOUNTER — Encounter (HOSPITAL_COMMUNITY): Payer: Self-pay | Admitting: Urgent Care

## 2011-07-13 HISTORY — PX: BREATH HYDROGEN TEST: GI29

## 2011-07-13 NOTE — Procedures (Signed)
PHYSICIAN: Dr. Jena Gauss   DATE OF PROCEDURE: 07/04/2011  DATE OF DISCHARGE: 07/04/2011   PROCEDURE: Hydrogen Breath Test  INDICATION FOR EXAMINATION: Melissa Fowler is a 45 y.o. female with chronic diarrhea & abdominal bloating.  PRE-PROCEDURE CHECK: Patient denied beans, grain, and fiber in the past 24 hours. She has been NPO for 12 hours. She denies smoking, sleep, or exercise within the past 30 minutes. She denied antibiotics within past two weeks. Denies diarrhea. Denies recent bowel prep or laxatives.    TEST SUGAR: Lactulose 25 grams  FINDINGS: Negative for small bowel bacterial overgrowth  ASSESSMENT: Chronic diarrhea & abdominal bloating most likely secondary to IBS-D.

## 2011-07-13 NOTE — Progress Notes (Signed)
Quick Note:  Please call the patient. Her stool study was normal. Please let her know that her hydrogen breath test was also normal. She does not have bacterial overgrowth in her small bowel. I suspect her symptoms are due to irritable bowel syndrome. Continue probiotics and fiber. Office visit in 3 months or sooner if needed.  ______

## 2011-07-16 NOTE — Progress Notes (Signed)
Quick Note:  Please call the patient. Her stool study was normal. Please let her know that her hydrogen breath test was also normal. She does not have bacterial overgrowth in her small bowel. I suspect her symptoms are due to irritable bowel syndrome. Continue probiotics and fiber. Office visit in 3 months or sooner if needed.  ______ 

## 2011-07-18 ENCOUNTER — Encounter (HOSPITAL_COMMUNITY): Payer: Self-pay | Admitting: Internal Medicine

## 2011-09-24 ENCOUNTER — Encounter: Payer: Self-pay | Admitting: Internal Medicine

## 2017-05-29 DIAGNOSIS — M79644 Pain in right finger(s): Secondary | ICD-10-CM | POA: Diagnosis not present

## 2017-08-05 DIAGNOSIS — K5732 Diverticulitis of large intestine without perforation or abscess without bleeding: Secondary | ICD-10-CM | POA: Diagnosis not present

## 2017-08-06 DIAGNOSIS — Z1389 Encounter for screening for other disorder: Secondary | ICD-10-CM | POA: Diagnosis not present

## 2017-08-06 DIAGNOSIS — R1032 Left lower quadrant pain: Secondary | ICD-10-CM | POA: Diagnosis not present

## 2017-09-12 DIAGNOSIS — R1909 Other intra-abdominal and pelvic swelling, mass and lump: Secondary | ICD-10-CM | POA: Diagnosis not present

## 2017-09-12 DIAGNOSIS — K660 Peritoneal adhesions (postprocedural) (postinfection): Secondary | ICD-10-CM | POA: Diagnosis not present

## 2017-09-12 DIAGNOSIS — K5730 Diverticulosis of large intestine without perforation or abscess without bleeding: Secondary | ICD-10-CM | POA: Diagnosis not present

## 2017-09-16 DIAGNOSIS — R1909 Other intra-abdominal and pelvic swelling, mass and lump: Secondary | ICD-10-CM | POA: Diagnosis not present

## 2017-09-16 DIAGNOSIS — K5730 Diverticulosis of large intestine without perforation or abscess without bleeding: Secondary | ICD-10-CM | POA: Diagnosis not present

## 2017-09-16 DIAGNOSIS — K660 Peritoneal adhesions (postprocedural) (postinfection): Secondary | ICD-10-CM | POA: Diagnosis not present

## 2017-09-17 DIAGNOSIS — R1909 Other intra-abdominal and pelvic swelling, mass and lump: Secondary | ICD-10-CM | POA: Diagnosis not present

## 2017-09-17 DIAGNOSIS — K660 Peritoneal adhesions (postprocedural) (postinfection): Secondary | ICD-10-CM | POA: Diagnosis not present

## 2017-09-17 DIAGNOSIS — K5730 Diverticulosis of large intestine without perforation or abscess without bleeding: Secondary | ICD-10-CM | POA: Diagnosis not present

## 2017-10-22 ENCOUNTER — Encounter: Payer: Self-pay | Admitting: Internal Medicine

## 2017-12-04 ENCOUNTER — Encounter: Payer: Self-pay | Admitting: Nurse Practitioner

## 2017-12-04 ENCOUNTER — Telehealth: Payer: Self-pay

## 2017-12-04 ENCOUNTER — Ambulatory Visit: Payer: Commercial Managed Care - PPO | Admitting: Nurse Practitioner

## 2017-12-04 ENCOUNTER — Other Ambulatory Visit: Payer: Self-pay

## 2017-12-04 VITALS — BP 122/87 | HR 89 | Temp 97.2°F | Ht 65.5 in | Wt 175.2 lb

## 2017-12-04 DIAGNOSIS — R109 Unspecified abdominal pain: Secondary | ICD-10-CM

## 2017-12-04 DIAGNOSIS — K529 Noninfective gastroenteritis and colitis, unspecified: Secondary | ICD-10-CM

## 2017-12-04 DIAGNOSIS — K5730 Diverticulosis of large intestine without perforation or abscess without bleeding: Secondary | ICD-10-CM

## 2017-12-04 DIAGNOSIS — K5790 Diverticulosis of intestine, part unspecified, without perforation or abscess without bleeding: Secondary | ICD-10-CM | POA: Insufficient documentation

## 2017-12-04 MED ORDER — PEG 3350-KCL-NA BICARB-NACL 420 G PO SOLR: 4000.0000 mL | ORAL | 0 refills | Status: DC

## 2017-12-04 NOTE — Progress Notes (Signed)
Primary Care Physician:  Juliette Alcide, MD Primary Gastroenterologist:  Dr. Jena Gauss  Chief Complaint  Patient presents with  . diverticular disease    consult for TCS  . Abdominal Pain    lower abd, comes/goes    HPI:   Melissa Fowler is a 52 y.o. female who presents on referral for diverticular disease and to schedule colonoscopy.  The patient has not been seen in our office since 2012.  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  Last colonoscopy completed 06/05/2011 for 75-month history of incessant nonbloody diarrhea.  A colonoscopy found anal papilla, otherwise normal rectum.  Left-sided diverticula, colonic mucosa normal status post segmental biopsy.  Normal terminal ileum.  Surgical pathology found the biopsies to be benign colonic and colorectal mucosa.  No recommendations made for timing of repeat exam but presumed 10-year repeat or started age 60.  She is currently 61.  Provided with referral include a surgical report from [facility].  Indicates she was brought to the hospital for mini laparotomy for exploration of left adnexa should probably have a probable left ovarian cyst, noted history of diverticular disease.  Indicated in her left lower abdomen she had an inflammatory mass with severe adhesive disease appeared to be arising from sigmoid diverticular disease and subsequently no surgery was performed.  Due to no prep, and severity of adhesive disease, recommended colonoscopy and treatment of diverticular disease as needed.  Did not appear to have acute diverticulitis at that time.  Today she states she's doing ok overall. Admits history of diverticular disease; has had diverticulitis before and is aware of the symptoms but not having anything like that going on now. She has intermittent/rare abdominal pain lower abdomen (bilateral) and described as sharp. Denies N/V, hematochezia, melena. Does have chronic diarrhea. She does  not have her gallbladder. Denies fever, chills, unintentional weight loss. Denies chest pain, dyspnea, dizziness, lightheadedness, syncope, near syncope. Denies any other upper or lower GI symptoms.  She states the surgeon told her her ovaries were ok but he "moved her intestines around a bit" and since then her abdominal pain has been improved.  Past Medical History:  Diagnosis Date  . Diverticulitis   . GERD (gastroesophageal reflux disease)   . Irritable bowel syndrome     Past Surgical History:  Procedure Laterality Date  . BREATH HYDROGEN TEST  07/13/2011      . CHOLECYSTECTOMY    . COLONOSCOPY  03/2006   Minimal friable anal canal, fairly extensive left-sided and transverse diverticula, normal terminal ileum  . COLONOSCOPY  06/05/2011   Dr Marlowe Alt papilla, diverticulosis, bening random bx  . FOOT SURGERY  2011/2012   bilateral  . HYDROGEN BREATH TEST  07/11/2011   Procedure: HYDROGEN BREATH TEST;  Surgeon: Corbin Ade, MD;  Location: [facility];  Service: Endoscopy;  Laterality: N/A;  7:30/for Bacterial Overgrowth  . PARTIAL HYSTERECTOMY    . TUBAL LIGATION      Current Outpatient Medications  Medication Sig Dispense Refill  . Aspirin-Acetaminophen-Caffeine (GOODY HEADACHE PO) Take by mouth as needed.    . diphenhydramine-acetaminophen (TYLENOL PM) 25-500 MG TABS Take 1 tablet by mouth at bedtime as needed. For sleep      No current facility-administered medications for this visit.     Allergies as of 12/04/2017 - Review Complete 12/04/2017  Allergen Reaction Noted  . Codeine Itching   . Cyclobenzaprine hcl  12/07/2009  . Metronidazole    . Nsaids  12/07/2009    Family History  Problem Relation Age of Onset  . Colon cancer Maternal Aunt 48  . Aneurysm Mother [id number]       Deceased, brain  . Gastric cancer Neg Hx   . Esophageal cancer Neg Hx     Social History   Socioeconomic History  . Marital status: Married    Spouse name: Not on file  . Number of  children: 3  . Years of education: Not on file  . Highest education level: Not on file  Social Needs  . Financial resource strain: Not on file  . Food insecurity - worry: Not on file  . Food insecurity - inability: Not on file  . Transportation needs - medical: Not on file  . Transportation needs - non-medical: Not on file  Occupational History  . Occupation: Arboriculturistrestaurant cook    Comment: part-time  Tobacco Use  . Smoking status: Current Every Day Smoker    Packs/day: 1.00    Types: Cigarettes  . Smokeless tobacco: Never Used  Substance and Sexual Activity  . Alcohol use: No    Frequency: Never    Comment: very rare  . Drug use: No  . Sexual activity: Not on file  Other Topics Concern  . Not on file  Social History Narrative  . Not on file    Review of Systems: General: Negative for anorexia, weight loss, fever, chills, fatigue, weakness. ENT: Negative for hoarseness, difficulty swallowing. CV: Negative for chest pain, angina, palpitations, peripheral edema.  Respiratory: Negative for dyspnea at rest, cough, sputum, wheezing.  GI: See history of present illness. GU:  Is having some vaginal bleeding and plans to follow-up with GYN.  MS: Negative for joint pain, low back pain.  Derm: Negative for rash or itching.  Endo: Negative for unusual weight change.  Heme: Negative for bruising or bleeding. Allergy: Negative for rash or hives.    Physical Exam: BP 122/87   Pulse 89   Temp (!) 97.2 F (36.2 C) (Oral)   Ht 5' 5.5" (1.664 m)   Wt 175 lb 3.2 oz (79.5 kg)   BMI 28.71 kg/m  General:   Alert and oriented. Pleasant and cooperative. Well-nourished and well-developed.  Eyes:  Without icterus, sclera clear and conjunctiva pink.  Ears:  Normal auditory acuity. Cardiovascular:  S1, S2 present without murmurs appreciated. Extremities without clubbing or edema. Respiratory:  Clear to auscultation bilaterally. No wheezes, rales, or rhonchi. No distress.  Gastrointestinal:   +BS, soft, and non-distended. Mild lower abdominal TTP. No HSM noted. No guarding or rebound. No masses appreciated.  Rectal:  Deferred  Musculoskalatal:  Symmetrical without gross deformities. Neurologic:  Alert and oriented x4;  grossly normal neurologically. Psych:  Alert and cooperative. Normal mood and affect. Heme/Lymph/Immune: No excessive bruising noted.    12/04/2017 8:53 AM   Disclaimer: This note was dictated with voice recognition software. Similar sounding words can inadvertently be transcribed and may not be corrected upon review.

## 2017-12-04 NOTE — Assessment & Plan Note (Signed)
Chronic diarrhea which is no worse currently.  The patient does not have a gallbladder is not a candidate for Viberzi.  Recommend she continue her current management, follow-up in 3 months.

## 2017-12-04 NOTE — Patient Instructions (Signed)
1. We will schedule your colonoscopy for you. 2. Pending the results of your colonoscopy, we will have you follow-up in 3 months to discuss next steps and further options. 3. Call us if you have any questions or concerns.   Enjoy the [place]!    At [facility] we value your feedback. You may receive a survey about your visit today. Please share your experience as we strive to create trusing relationships with our patients to provide genuine, compassionate, quality care.

## 2017-12-04 NOTE — Assessment & Plan Note (Signed)
Patient does have diverticular disease and has a history of diverticulitis.  She is not having significant abdominal pain.  States no symptoms anywhere near when she had an acute diverticulitis episode previously.  Surgical report, which will be marked for scanning into the system.  Indicates significant adhesions and inflammatory mass arising from diverticula in the sigmoid colon.  Recommended colonoscopy for further evaluation and consider treatment.  We will proceed with colonoscopy as requested.  For colonoscopy looks good, we can consider referral to general surgeon for treatment of sigmoid diverticular and significant adhesions.  Follow-up in 3 months.  Proceed with TCS with Dr. Jena Gaussourk in near future: the risks, benefits, and alternatives have been discussed with the patient in detail. The patient states understanding and desires to proceed.  The patient is not on any anticoagulants, anxiolytics, chronic pain medications, or antidepressants.  Her previous colonoscopy was completed with conscious sedation and this should be adequate for her procedure currently.

## 2017-12-04 NOTE — Telephone Encounter (Signed)
PA info for TCS submitted via [employer] website. 

## 2017-12-04 NOTE — Assessment & Plan Note (Signed)
Noted mild intermittent lower abdominal pain.  History of chronic diarrhea and likely chronic IBS diarrhea type.  Per surgical report from gynecology the patient has "an inflammatory mass and significant adhesions arising from sigmoid diverticula."  Likely component of lower abdominal pain related to this.  Her IBS symptoms are not worsened.  We will proceed with colonoscopy as requested by gynecology to further evaluate and consider treatment, as per below.  Follow-up in 3 months.

## 2017-12-04 NOTE — Progress Notes (Signed)
CC'ED TO PCP 

## 2017-12-06 NOTE — Telephone Encounter (Signed)
[facility] at [facility] called office. No PA needed for TCS. Ref# [id number].

## 2017-12-24 ENCOUNTER — Telehealth: Payer: Self-pay | Admitting: *Deleted

## 2017-12-24 ENCOUNTER — Encounter: Payer: Self-pay | Admitting: *Deleted

## 2017-12-24 NOTE — Telephone Encounter (Signed)
Spoke with pt and we have changed appt time on 01/22/18 to 7:30am. I have mailed patient new instructions for prep as times have changed. Nothing further needed.

## 2018-01-22 ENCOUNTER — Encounter (HOSPITAL_COMMUNITY): Payer: Self-pay | Admitting: *Deleted

## 2018-01-22 ENCOUNTER — Other Ambulatory Visit: Payer: Self-pay

## 2018-01-22 ENCOUNTER — Ambulatory Visit (HOSPITAL_COMMUNITY)
Admission: RE | Admit: 2018-01-22 | Discharge: 2018-01-22 | Disposition: A | Discharge status: Home / Self Care | Payer: Commercial Managed Care - PPO | Source: Ambulatory Visit | Attending: Internal Medicine | Admitting: Internal Medicine

## 2018-01-22 ENCOUNTER — Encounter (HOSPITAL_COMMUNITY)
Admission: RE | Disposition: A | Discharge status: Home / Self Care | Payer: Self-pay | Source: Ambulatory Visit | Attending: Internal Medicine

## 2018-01-22 DIAGNOSIS — Z800 Family history of malignant neoplasm of digestive organs: Secondary | ICD-10-CM | POA: Insufficient documentation

## 2018-01-22 DIAGNOSIS — Z886 Allergy status to analgesic agent status: Secondary | ICD-10-CM | POA: Insufficient documentation

## 2018-01-22 DIAGNOSIS — K219 Gastro-esophageal reflux disease without esophagitis: Secondary | ICD-10-CM | POA: Diagnosis not present

## 2018-01-22 DIAGNOSIS — K5730 Diverticulosis of large intestine without perforation or abscess without bleeding: Secondary | ICD-10-CM | POA: Diagnosis not present

## 2018-01-22 DIAGNOSIS — Z1212 Encounter for screening for malignant neoplasm of rectum: Secondary | ICD-10-CM | POA: Diagnosis not present

## 2018-01-22 DIAGNOSIS — Z885 Allergy status to narcotic agent status: Secondary | ICD-10-CM | POA: Insufficient documentation

## 2018-01-22 DIAGNOSIS — Z888 Allergy status to other drugs, medicaments and biological substances status: Secondary | ICD-10-CM | POA: Diagnosis not present

## 2018-01-22 DIAGNOSIS — Z79899 Other long term (current) drug therapy: Secondary | ICD-10-CM | POA: Insufficient documentation

## 2018-01-22 DIAGNOSIS — K648 Other hemorrhoids: Secondary | ICD-10-CM

## 2018-01-22 DIAGNOSIS — K580 Irritable bowel syndrome with diarrhea: Secondary | ICD-10-CM | POA: Insufficient documentation

## 2018-01-22 DIAGNOSIS — F17210 Nicotine dependence, cigarettes, uncomplicated: Secondary | ICD-10-CM | POA: Insufficient documentation

## 2018-01-22 DIAGNOSIS — K5790 Diverticulosis of intestine, part unspecified, without perforation or abscess without bleeding: Secondary | ICD-10-CM | POA: Diagnosis not present

## 2018-01-22 DIAGNOSIS — Z1211 Encounter for screening for malignant neoplasm of colon: Secondary | ICD-10-CM | POA: Diagnosis not present

## 2018-01-22 HISTORY — PX: BIOPSY: SHX5522

## 2018-01-22 HISTORY — PX: COLONOSCOPY: SHX5424

## 2018-01-22 SURGERY — COLONOSCOPY
Anesthesia: Moderate Sedation

## 2018-01-22 MED ORDER — STERILE WATER FOR IRRIGATION IR SOLN
Status: DC | PRN
  Administered 2018-01-22: 2.5 mL

## 2018-01-22 MED ORDER — MIDAZOLAM HCL 5 MG/5ML IJ SOLN
INTRAMUSCULAR | Status: AC
  Filled 2018-01-22: qty 10

## 2018-01-22 MED ORDER — SODIUM CHLORIDE 0.9 % IV SOLN
INTRAVENOUS | Status: DC
  Administered 2018-01-22: 07:00:00 via INTRAVENOUS

## 2018-01-22 MED ORDER — MEPERIDINE HCL 100 MG/ML IJ SOLN
INTRAMUSCULAR | Status: AC
  Filled 2018-01-22: qty 2

## 2018-01-22 MED ORDER — ONDANSETRON HCL 4 MG/2ML IJ SOLN
INTRAMUSCULAR | Status: AC
  Filled 2018-01-22: qty 2

## 2018-01-22 MED ORDER — MIDAZOLAM HCL 5 MG/5ML IJ SOLN
INTRAMUSCULAR | Status: DC | PRN
  Administered 2018-01-22: 2 mg via INTRAVENOUS
  Administered 2018-01-22 (×3): 1 mg via INTRAVENOUS
  Administered 2018-01-22: 2 mg via INTRAVENOUS

## 2018-01-22 MED ORDER — MEPERIDINE HCL 100 MG/ML IJ SOLN
INTRAMUSCULAR | Status: DC | PRN
  Administered 2018-01-22: 50 mg via INTRAVENOUS
  Administered 2018-01-22: 25 mg via INTRAVENOUS
  Administered 2018-01-22: 50 mg via INTRAVENOUS
  Administered 2018-01-22: 25 mg via INTRAVENOUS

## 2018-01-22 MED ORDER — SIMETHICONE 40 MG/0.6ML PO SUSP
ORAL | Status: AC
  Filled 2018-01-22: qty 30

## 2018-01-22 MED ORDER — ONDANSETRON HCL 4 MG/2ML IJ SOLN
INTRAMUSCULAR | Status: DC | PRN
  Administered 2018-01-22: 4 mg via INTRAVENOUS

## 2018-01-22 NOTE — Discharge Instructions (Signed)
Colonoscopy Discharge Instructions  Read the instructions outlined below and refer to this sheet in the next few weeks. These discharge instructions provide you with general information on caring for yourself after you leave the hospital. Your doctor may also give you specific instructions. While your treatment has been planned according to the most current medical practices available, unavoidable complications occasionally occur. If you have any problems or questions after discharge, call Dr. Jena Gauss at [phone]. ACTIVITY  You may resume your regular activity, but move at a slower pace for the next 24 hours.   Take frequent rest periods for the next 24 hours.   Walking will help get rid of the air and reduce the bloated feeling in your belly (abdomen).   No driving for 24 hours (because of the medicine (anesthesia) used during the test).    Do not sign any important legal documents or operate any machinery for 24 hours (because of the anesthesia used during the test).  NUTRITION  Drink plenty of fluids.   You may resume your normal diet as instructed by your doctor.   Begin with a light meal and progress to your normal diet. Heavy or fried foods are harder to digest and may make you feel sick to your stomach (nauseated).   Avoid alcoholic beverages for 24 hours or as instructed.  MEDICATIONS  You may resume your normal medications unless your doctor tells you otherwise.  WHAT YOU CAN EXPECT TODAY  Some feelings of bloating in the abdomen.   Passage of more gas than usual.   Spotting of blood in your stool or on the toilet paper.  IF YOU HAD POLYPS REMOVED DURING THE COLONOSCOPY:  No aspirin products for 7 days or as instructed.   No alcohol for 7 days or as instructed.   Eat a soft diet for the next 24 hours.  FINDING OUT THE RESULTS OF YOUR TEST Not all test results are available during your visit. If your test results are not back during the visit, make an appointment  with your caregiver to find out the results. Do not assume everything is normal if you have not heard from your caregiver or the medical facility. It is important for you to follow up on all of your test results.  SEEK IMMEDIATE MEDICAL ATTENTION IF:  You have more than a spotting of blood in your stool.   Your belly is swollen (abdominal distention).   You are nauseated or vomiting.   You have a temperature over 101.   You have abdominal pain or discomfort that is severe or gets worse throughout the day.    Colon Diverticulosis information provided  Repeat colonoscopy in 10 years for screening purposes  Further recommendations to follow once laboratory results are back for review   Diverticulosis Diverticulosis is a condition that develops when small pouches (diverticula) form in the wall of the large intestine (colon). The colon is where water is absorbed and stool is formed. The pouches form when the inside layer of the colon pushes through weak spots in the outer layers of the colon. You may have a few pouches or many of them. What are the causes? The cause of this condition is not known. What increases the risk? The following factors may make you more likely to develop this condition:  Being older than age 55. Your risk for this condition increases with age. Diverticulosis is rare among people younger than age 28. By age 63, many people have it.  Eating a  low-fiber diet.  Having frequent constipation.  Being overweight.  Not getting enough exercise.  Smoking.  Taking over-the-counter pain medicines, like aspirin and ibuprofen.  Having a family history of diverticulosis.  What are the signs or symptoms? In most people, there are no symptoms of this condition. If you do have symptoms, they may include:  Bloating.  Cramps in the abdomen.  Constipation or diarrhea.  Pain in the lower left side of the abdomen.  How is this diagnosed? This condition is most  often diagnosed during an exam for other colon problems. Because diverticulosis usually has no symptoms, it often cannot be diagnosed independently. This condition may be diagnosed by:  Using a flexible scope to examine the colon (colonoscopy).  Taking an X-ray of the colon after dye has been put into the colon (barium enema).  Doing a CT scan.  How is this treated? You may not need treatment for this condition if you have never developed an infection related to diverticulosis. If you have had an infection before, treatment may include:  Eating a high-fiber diet. This may include eating more fruits, vegetables, and grains.  Taking a fiber supplement.  Taking a live bacteria supplement (probiotic).  Taking medicine to relax your colon.  Taking antibiotic medicines.  Follow these instructions at home:  Drink 6-8 glasses of water or more each day to prevent constipation.  Try not to strain when you have a bowel movement.  If you have had an infection before: ? Eat more fiber as directed by your health care provider or your diet and nutrition specialist (dietitian). ? Take a fiber supplement or probiotic, if your health care provider approves.  Take over-the-counter and prescription medicines only as told by your health care provider.  If you were prescribed an antibiotic, take it as told by your health care provider. Do not stop taking the antibiotic even if you start to feel better.  Keep all follow-up visits as told by your health care provider. This is important. Contact a health care provider if:  You have pain in your abdomen.  You have bloating.  You have cramps.  You have not had a bowel movement in 3 days. Get help right away if:  Your pain gets worse.  Your bloating becomes very bad.  You have a fever or chills, and your symptoms suddenly get worse.  You vomit.  You have bowel movements that are bloody or black.  You have bleeding from your  rectum. Summary  Diverticulosis is a condition that develops when small pouches (diverticula) form in the wall of the large intestine (colon).  You may have a few pouches or many of them.  This condition is most often diagnosed during an exam for other colon problems.  If you have had an infection related to diverticulosis, treatment may include increasing the fiber in your diet, taking supplements, or taking medicines. This information is not intended to replace advice given to you by your health care provider. Make sure you discuss any questions you have with your health care provider. Document Released: 05/31/2004 Document Revised: 07/23/2016 Document Reviewed: 07/23/2016 Elsevier Interactive Patient Education  2017 [employer].

## 2018-01-22 NOTE — Op Note (Signed)
[facility] Patient Name: Melissa Fowler Procedure Date: 01/22/2018 7:27 AM MRN: [id number] Date of Birth: May 04, 1966 Attending MD: Gennette Pac , MD CSN: [id number] Age: 52 Admit Type: Outpatient Procedure:                Colonoscopy Indications:              Screening for colorectal malignant neoplasm Providers:                Gennette Pac, MD, Nena Polio, RN, Edythe Clarity, Technician Referring MD:              Medicines:                Midazolam 7 mg IV, Meperidine 150 mg IV,                            Ondansetron 4 mg IV Complications:            No immediate complications. Estimated Blood Loss:     Estimated blood loss was minimal. Estimated blood                            loss was minimal. Procedure:                Pre-Anesthesia Assessment:                           - Prior to the procedure, a History and Physical                            was performed, and patient medications and                            allergies were reviewed. The patient's tolerance of                            previous anesthesia was also reviewed. The risks                            and benefits of the procedure and the sedation                            options and risks were discussed with the patient.                            All questions were answered, and informed consent                            was obtained. Prior Anticoagulants: The patient has                            taken no previous anticoagulant or antiplatelet  agents. ASA Grade Assessment: II - A patient with                            mild systemic disease. After reviewing the risks                            and benefits, the patient was deemed in                            satisfactory condition to undergo the procedure.                           After obtaining informed consent, the colonoscope                            was passed under direct vision.  Throughout the                            procedure, the patient's blood pressure, pulse, and                            oxygen saturations were monitored continuously. The                            EC-[id number] ([id number]) scope was introduced through                            the anus and advanced to the 5 cm into the ileum.                            The colonoscopy was performed without difficulty.                            The patient tolerated the procedure well. The                            quality of the bowel preparation was adequate. The                            quality of the bowel preparation was adequate. Scope In: 7:51:11 AM Scope Out: 8:16:19 AM Scope Withdrawal Time: 0 hours 7 minutes 45 seconds  Total Procedure Duration: 0 hours 25 minutes 8 seconds  Findings:      The perianal and digital rectal examinations were normal. noncompliant       left colon. Could not adult scope. Obtain the pediatric colonoscope was       able to advance to the cecum.      Multiple small and large-mouthed diverticula were found in the sigmoid       colon, descending colon, splenic flexure and transverse colon.      Non-bleeding internal hemorrhoids were found during retroflexion. The       hemorrhoids were moderate and medium-sized. Biopsies were taken with a       cold forceps for histology. Estimated blood loss was minimal.  The exam was otherwise without abnormality on direct and retroflexion       views. distal 5 cm of terminal ileum appeared normal. Segmental biopsies       in the left and right colon taken for histologic study Impression:               - Diverticulosis in the sigmoid colon, in the                            descending colon, at the splenic flexure and in the                            transverse colon. Noncompliant left colon.                           - Non-bleeding internal hemorrhoids. segmental                            colon biopsies taken. Normal terminal  ileum.- The                            examination was otherwise normal on direct and                            retroflexion views. Moderate Sedation:      Moderate (conscious) sedation was administered by the endoscopy nurse       and supervised by the endoscopist. The following parameters were       monitored: oxygen saturation, heart rate, blood pressure, respiratory       rate, EKG, adequacy of pulmonary ventilation, and response to care.       Total physician intraservice time was 37 minutes. Recommendation:           - Patient has a contact number available for                            emergencies. The signs and symptoms of potential                            delayed complications were discussed with the                            patient. Return to normal activities tomorrow.                            Written discharge instructions were provided to the                            patient.                           - Advance diet as tolerated.                           - Continue present medications.                           -  Repeat colonoscopy in 10 years for screening                            purposes. Celiac serologies for completion of                            evaluation of diarrhea.                           - Return to GI office in 2 months. Procedure Code(s):        --- Professional ---                           [phone], Colonoscopy, flexible; with biopsy, single                            or multiple                           G0500, Moderate sedation services provided by the                            same physician or other qualified health care                            professional performing a gastrointestinal                            endoscopic service that sedation supports,                            requiring the presence of an independent trained                            observer to assist in the monitoring of the                            patient's  level of consciousness and physiological                            status; initial 15 minutes of intra-service time;                            patient age 31 years or older (additional time may                            be reported with [id number], as appropriate)                           [phone], Moderate sedation services provided by the                            same physician or other qualified health care  professional performing the diagnostic or                            therapeutic service that the sedation supports,                            requiring the presence of an independent trained                            observer to assist in the monitoring of the                            patient's level of consciousness and physiological                            status; each additional 15 minutes intraservice                            time (List separately in addition to code for                            primary service) Diagnosis Code(s):        --- Professional ---                           Z12.11, Encounter for screening for malignant                            neoplasm of colon                           K64.8, Other hemorrhoids                           K57.30, Diverticulosis of large intestine without                            perforation or abscess without bleeding CPT copyright 2017 American Medical Association. All rights reserved. The codes documented in this report are preliminary and upon coder review may  be revised to meet current compliance requirements. Gerrit Friends. , MD Gennette Pac, MD 01/22/2018 8:26:03 AM This report has been signed electronically. Number of Addenda: 0

## 2018-01-22 NOTE — H&P (Signed)
. @LOGO @   Primary Care Physician:  Juliette Alcide, MD Primary Gastroenterologist:  Dr. Jena Gauss  Pre-Procedure History & Physical: HPI:  Melissa Fowler is a 52 y.o. female here for screening colonoscopy. Has chronic postprandial diarrhea. No Prior celiac screen.  Past Medical History:  Diagnosis Date  . Diverticulitis   . GERD (gastroesophageal reflux disease)   . Irritable bowel syndrome     Past Surgical History:  Procedure Laterality Date  . BREATH HYDROGEN TEST  07/13/2011      . CHOLECYSTECTOMY    . COLONOSCOPY  03/2006   Minimal friable anal canal, fairly extensive left-sided and transverse diverticula, normal terminal ileum  . COLONOSCOPY  06/05/2011   Dr Marlowe Alt papilla, diverticulosis, bening random bx  . FOOT SURGERY  2011/2012   bilateral  . HYDROGEN BREATH TEST  07/11/2011   Procedure: HYDROGEN BREATH TEST;  Surgeon: Corbin Ade, MD;  [facility];  Service: Endoscopy;  Laterality: N/A;  7:30/for Bacterial Overgrowth  . PARTIAL HYSTERECTOMY    . TUBAL LIGATION      Prior to Admission medications   Medication Sig Start Date End Date Taking? Authorizing Provider  Aspirin-Acetaminophen-Caffeine (GOODY HEADACHE PO) Take 1 packet by mouth daily as needed (for headache).    Yes [provider]  diphenhydramine-acetaminophen (TYLENOL PM) 25-500 MG TABS Take 2 tablets by mouth at bedtime as needed (for sleep).    Yes [provider]  polyethylene glycol-electrolytes (TRILYTE) 420 g solution Take 4,000 mLs by mouth as directed. 12/04/17  Yes Corbin Ade, MD    Allergies as of 12/04/2017 - Review Complete 12/04/2017  Allergen Reaction Noted  . Codeine Itching   . Cyclobenzaprine hcl  12/07/2009  . Metronidazole    . Nsaids  12/07/2009    Family History  Problem Relation Age of Onset  . Colon cancer Maternal Aunt 48  . Aneurysm Mother 28       Deceased, brain  . Gastric cancer Neg Hx   . Esophageal cancer Neg Hx      Social History   Socioeconomic History  . Marital status: Married    Spouse name: Not on file  . Number of children: 3  . Years of education: Not on file  . Highest education level: Not on file  Occupational History  . Occupation: Arboriculturist    Comment: part-time  Social Needs  . Financial resource strain: Not on file  . Food insecurity:    Worry: Not on file    Inability: Not on file  . Transportation needs:    Medical: Not on file    Non-medical: Not on file  Tobacco Use  . Smoking status: Current Every Day Smoker    Packs/day: 1.00    Types: Cigarettes  . Smokeless tobacco: Never Used  Substance and Sexual Activity  . Alcohol use: No    Frequency: Never    Comment: very rare  . Drug use: No  . Sexual activity: Not on file  Lifestyle  . Physical activity:    Days per week: Not on file    Minutes per session: Not on file  . Stress: Not on file  Relationships  . Social connections:    Talks on phone: Not on file    Gets together: Not on file    Attends religious service: Not on file    Active member of club or organization: Not on file    Attends meetings of clubs or organizations: Not on  file    Relationship status: Not on file  . Intimate partner violence:    Fear of current or ex partner: Not on file    Emotionally abused: Not on file    Physically abused: Not on file    Forced sexual activity: Not on file  Other Topics Concern  . Not on file  Social History Narrative  . Not on file    Review of Systems: See HPI, otherwise negative ROS  Physical Exam: BP 106/71   Pulse 83   Temp 98.1 F (36.7 C) (Oral)   Resp 15   Ht 5' 5.5" (1.664 m)   Wt 171 lb (77.6 kg)   SpO2 97%   BMI 28.02 kg/m  General:   Alert,  Well-developed, well-nourished, pleasant and cooperative in NAD Neck:  Supple; no masses or thyromegaly. No significant cervical adenopathy. Lungs:  Clear throughout to auscultation.   No wheezes, crackles, or rhonchi. No acute  distress. Heart:  Regular rate and rhythm; no murmurs, clicks, rubs,  or gallops. Abdomen: Non-distended, normal bowel sounds.  Soft and nontender without appreciable mass or hepatosplenomegaly.  Pulses:  Normal pulses noted. Extremities:  Without clubbing or edema.  Impression:  52 year old lady here for screening colonoscopy. Chronic postprandial diarrhea.   Recommendations: I have offered the patient a colonoscopy today.   The risks, benefits, limitations, alternatives and imponderables have been reviewed with the patient. Questions have been answered. All parties are agreeable.     Notice: This dictation was prepared with Dragon dictation along with smaller phrase technology. Any transcriptional errors that result from this process are unintentional and may not be corrected upon review.

## 2018-01-23 ENCOUNTER — Encounter: Payer: Self-pay | Admitting: Internal Medicine

## 2018-01-23 LAB — IGA: IgA: 239 mg/dL (ref 87–352)

## 2018-01-24 LAB — TISSUE TRANSGLUTAMINASE, IGA

## 2018-01-28 ENCOUNTER — Encounter (HOSPITAL_COMMUNITY): Payer: Self-pay | Admitting: Internal Medicine

## 2018-03-11 ENCOUNTER — Ambulatory Visit: Payer: Commercial Managed Care - PPO | Admitting: Nurse Practitioner

## 2018-03-11 ENCOUNTER — Encounter: Payer: Self-pay | Admitting: Nurse Practitioner

## 2018-03-11 VITALS — BP 117/82 | HR 93 | Temp 97.1°F | Ht 65.5 in | Wt 171.8 lb

## 2018-03-11 DIAGNOSIS — K529 Noninfective gastroenteritis and colitis, unspecified: Secondary | ICD-10-CM

## 2018-03-11 DIAGNOSIS — R109 Unspecified abdominal pain: Secondary | ICD-10-CM

## 2018-03-11 DIAGNOSIS — R112 Nausea with vomiting, unspecified: Secondary | ICD-10-CM | POA: Diagnosis not present

## 2018-03-11 MED ORDER — CHOLESTYRAMINE LIGHT 4 G PO PACK
4.0000 g | PACK | Freq: Three times a day (TID) | ORAL | 3 refills | Status: DC
Start: 2018-03-11 — End: 2018-05-20

## 2018-03-11 NOTE — Assessment & Plan Note (Addendum)
Lower abdominal pain.  This is persistent, but describes is not as bad.  She has been seen and evaluated by gynecology.  She was told initially she had a mass on her ovary status post partial hysterectomy.  However, when surgery was attempted they did not see a mass in her ovary was left in situ.  There is a chance that this is more related to gynecological issues.  However, we will treat her diarrhea as per above.  This may include use of Bentyl if cholestyramine does not work for her diarrhea.  This will shed some light on possible GI etiologies.  Colonoscopy is normal which is reassuring.  She just had an MRI in November 2018 and we will request these reports.  There may be indication for further imaging in the future.  No red flag or warning signs or symptoms.  Follow-up in 2 months.

## 2018-03-11 NOTE — Assessment & Plan Note (Signed)
The patient describes chronic diarrhea which she indicates started around the time she had her gallbladder out.  She has multiple bowel movements a day.  She does have abdominal pain, as per below, however this is not relieved with defecation in a pattern consistent with irritable bowel syndrome.  However, she was previously diagnosed with IBS.  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  I will start her on cholestyramine to see if this helps.  I requested a progress report in 2 weeks.  If this is ineffective we can trial Bentyl for her diarrhea and abdominal pain.  Follow-up in 2 months.

## 2018-03-11 NOTE — Progress Notes (Signed)
Referring Provider: Juliette Alcide, MD Primary Care Physician:  Juliette Alcide, MD Primary GI:  Dr. Jena Gauss  Chief Complaint  Patient presents with  . Diarrhea    up to 5-6 times per day  . Abdominal Pain    bilateral lower quadrants, comes/goes  . Nausea    w/ vomiting over the weekend    HPI:   Melissa Fowler is a 52 y.o. female who presents for follow-up on diarrhea, abdominal pain, nausea.  Patient was last seen in our office 12/04/2017 for chronic diarrhea, abdominal pain, diverticulosis.  Noted history of IBS, GERD, diverticulosis/flares of diverticulitis.  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  At her last visit she admitted history of diverticular disease, previous diverticulitis.  Her symptoms at her last visit were not like her typical diverticulitis symptoms.  Noted intermittent/rare abdominal pain in the lower abdomen bilaterally, chronic diarrhea.  Status post cholecystectomy.  No other GI symptoms.  Recommended schedule colonoscopy as she is currently due, follow-up in 3 months.  Colonoscopy was completed 01/22/2018 which found multiple small and large mouth diverticula in the sigmoid colon, descending colon, splenic flexure, transverse colon.  The procedure was completed with a pediatric scope as the adult scope would not pass and this is noted to be noncompliant left colon.  Nonbleeding internal hemorrhoids.  Biopsies were taken.  Recommended repeat colonoscopy in 10 years, celiac serologies, follow-up 2 months.  Surgical pathology found the biopsies to be colonic mucosa with no specific pathological changes.  Celiac serologies completed 01/22/2018 which were normal.  Today she states she'd doing ok overall. Reviewed the colonoscopy reports. She has had  chronic diarrhea for at least 1 decade. Her diarrhea started around the time of her cholecystectomy. Still has lower abdominal pain but "not as bad" which is intermittent, rated 3-4/10, stabbing pain. No improvement after a bowel movement. She has been seen related to ovaries s/p partial hystorectomy with vaginal spotting. States they found a mass on her ovary, but no mass found during surgery. She had 2 pelvic U/S at a freestanding facility and an MRI at [facility] (done 07/2017). Had an episode of N/V this weekend which self-resolved. Thinks it's related to getting overheated at work. Denies fever, chills, unintentional weight loss, hematochezia, melena. Denies chest pain, dyspnea, dizziness, lightheadedness, syncope, near syncope. Denies any other upper or lower GI symptoms.  Uses ASA powders; previously around 2 a day, now cut back to 1 a day. Recommended continue to reduce and eliminate altogether.  Past Medical History:  Diagnosis Date  . Diverticulitis   . GERD (gastroesophageal reflux disease)   . Irritable bowel syndrome     Past Surgical History:  Procedure Laterality Date  . BIOPSY  01/22/2018   Procedure: BIOPSY;  Surgeon: Corbin Ade, MD;  [facility]: [facility];  Service: Endoscopy;;  ascending and descending/sigmoid  . BREATH HYDROGEN TEST  07/13/2011      . CHOLECYSTECTOMY    . COLONOSCOPY  03/2006   Minimal friable anal canal, fairly extensive left-sided and transverse diverticula, normal terminal ileum  . COLONOSCOPY  06/05/2011   Dr Marlowe Alt papilla, diverticulosis, bening random bx  . COLONOSCOPY N/A 01/22/2018   Procedure: COLONOSCOPY;  Surgeon: Corbin Ade, MD;  [facility]: [facility];  Service: Endoscopy;  Laterality: N/A;  1:00pm  . FOOT SURGERY  2011/2012   bilateral  . HYDROGEN BREATH TEST  07/11/2011   Procedure: HYDROGEN BREATH TEST;  Surgeon: Corbin Ade, MD;  [facility]: [facility];  Service: Endoscopy;  Laterality: N/A;  7:30/for Bacterial Overgrowth  .  PARTIAL HYSTERECTOMY    . TUBAL LIGATION      Current Outpatient Medications  Medication Sig Dispense Refill  . Aspirin-Acetaminophen-Caffeine (GOODYS EXTRA STRENGTH PO) Take by mouth as needed.    . diphenhydramine-acetaminophen (TYLENOL PM) 25-500 MG TABS tablet Take 1 tablet by mouth at bedtime as needed.     No current facility-administered medications for this visit.     Allergies as of 03/11/2018 - Review Complete 03/11/2018  Allergen Reaction Noted  . Codeine Itching   . Cyclobenzaprine hcl Other (See Comments) 12/07/2009  . Morphine and related  01/22/2018  . Nsaids Other (See Comments) 12/07/2009  . Metronidazole Rash     Family History  Problem Relation Age of Onset  . Colon cancer Maternal Aunt 48  . Aneurysm Mother 63       Deceased, brain  . Gastric cancer Neg Hx   . Esophageal cancer Neg Hx     Social History   Socioeconomic History  . Marital status: Married    Spouse name: Not on file  . Number of children: 3  . Years of education: Not on file  . Highest education level: Not on file  Occupational History  . Occupation: Arboriculturist    Comment: part-time  Social Needs  . Financial resource strain: Not on file  . Food insecurity:    Worry: Not on file    Inability: Not on file  . Transportation needs:    Medical: Not on file    Non-medical: Not on file  Tobacco Use  . Smoking status: Current Every Day Smoker    Packs/day: 1.00    Types: Cigarettes  . Smokeless tobacco: Never Used  Substance and Sexual Activity  . Alcohol use: No    Frequency: Never    Comment: very rare  . Drug use: No  . Sexual activity: Not on file  Lifestyle  . Physical activity:    Days per week: Not on file    Minutes per session: Not on file  . Stress: Not on file  Relationships  . Social connections:    Talks on phone: Not on file    Gets together: Not on file    Attends religious service: Not on file    Active member of club or organization: Not on file     Attends meetings of clubs or organizations: Not on file    Relationship status: Not on file  Other Topics Concern  . Not on file  Social History Narrative  . Not on file    Review of Systems: General: Negative for anorexia, weight loss, fever, chills, fatigue, weakness. ENT: Negative for hoarseness, difficulty swallowing. CV: Negative for chest pain, angina, palpitations, peripheral edema.  Respiratory: Negative for dyspnea at rest, cough, sputum, wheezing.  GI: See history of present illness. Endo: Negative for unusual weight change.    Physical Exam: BP 117/82   Pulse 93   Temp (!) 97.1 F (36.2 C) (Oral)   Ht 5' 5.5" (1.664 m)   Wt 171 lb 12.8 oz (77.9 kg)   BMI 28.15 kg/m  General:   Alert and oriented. Pleasant and cooperative. Well-nourished and well-developed.  Eyes:  Without icterus, sclera clear and conjunctiva pink.  Ears:  Normal auditory acuity. Cardiovascular:  S1, S2 present without murmurs appreciated. Extremities without clubbing or edema. Respiratory:  Clear to auscultation bilaterally. No wheezes, rales, or rhonchi. No distress.  Gastrointestinal:  +BS, soft, non-tender and non-distended. No HSM noted. No guarding or rebound. No masses appreciated.  Rectal:  Deferred  Musculoskalatal:  Symmetrical without gross deformities. Neurologic:  Alert and oriented x4;  grossly normal neurologically. Psych:  Alert and cooperative. Normal mood and affect. Heme/Lymph/Immune: No excessive bruising noted.    03/11/2018 8:37 AM   Disclaimer: This note was dictated with voice recognition software. Similar sounding words can inadvertently be transcribed and may not be corrected upon review.

## 2018-03-11 NOTE — Progress Notes (Signed)
CC'D TO PCP °

## 2018-03-11 NOTE — Assessment & Plan Note (Signed)
The patient did describe a isolated episode of nausea and vomiting this weekend.  She thinks this was related to being overheated at work.  This happens occasionally to her.  No ongoing nausea or vomiting.  Recommend she continue to monitor.  There could be an element of GERD or gastritis at play given her chronic aspirin powder use.  I recommended she continue to reduce her use of aspirin powders and eventually eliminate altogether.  I cautioned her that if she does take aspirin powders to drink plenty of water, at least one full glass, to help prevent NSAID related ulcers.  Follow-up in 2 months.

## 2018-03-11 NOTE — Patient Instructions (Signed)
1. We will request your previous ultrasound and MRI reports. 2. I have sent in cholestyramine to your pharmacy.  This comes in packets of powder.  Take 1 packet, 3 times a day, with meals. 3. Call us in 2 weeks and let us know if it is helping. 4. There may be other medications we can try depending on how the cholestyramine helps (or does not help). 5. Follow-up in our office in 2 months. 6. Call us if you have any questions or concerns.  At [facility] we value your feedback. You may receive a survey about your visit today. Please share your experience as we strive to create trusting relationships with our patients to provide genuine, compassionate, quality care.  It was great to see you today!  I hope you have a wonderful summer!!

## 2018-05-20 ENCOUNTER — Ambulatory Visit: Payer: Commercial Managed Care - PPO | Admitting: Gastroenterology

## 2018-05-20 ENCOUNTER — Encounter: Payer: Self-pay | Admitting: Gastroenterology

## 2018-05-20 VITALS — BP 113/80 | HR 89 | Temp 97.8°F | Ht 65.5 in | Wt 166.0 lb

## 2018-05-20 DIAGNOSIS — N898 Other specified noninflammatory disorders of vagina: Secondary | ICD-10-CM

## 2018-05-20 DIAGNOSIS — K529 Noninfective gastroenteritis and colitis, unspecified: Secondary | ICD-10-CM | POA: Diagnosis not present

## 2018-05-20 DIAGNOSIS — Z8742 Personal history of other diseases of the female genital tract: Secondary | ICD-10-CM | POA: Diagnosis not present

## 2018-05-20 DIAGNOSIS — K5790 Diverticulosis of intestine, part unspecified, without perforation or abscess without bleeding: Secondary | ICD-10-CM

## 2018-05-20 MED ORDER — COLESTIPOL HCL 1 G PO TABS: 2.0000 g | ORAL_TABLET | Freq: Every day | ORAL | 5 refills | Status: AC

## 2018-05-20 NOTE — Patient Instructions (Signed)
1. Try Colestid 2 tablets once daily for diarrhea. If you develop constipation, hold for couple of days and restart at one tablet daily. If not strong enough, let me know and we can increase dose.  2. I will discuss your previous imaging and new symptoms with Dr. Jena Gauss. We will call and set up appropriate imaging in the near future.

## 2018-05-20 NOTE — Assessment & Plan Note (Addendum)
52 y/o with chronic diarrhea worse since remote cholecystectomy. 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 Recent colonoscopy with negative random colon biopsies. Celiac screen negative. Prevalite TID constipated her and made her feel worse. Did not like taste. We will try lose dose Colestid 1-2grams daily.   She is now complaining of passing air from her vagina. ?stool leaking from vagina as outline above. H/o significant diverticular disease as noted during mini laparotomy last year to evaluate left adnexal mass. No signs of acute diverticulitis at this time.   She would benefit from reimaging. Will need rectal contrast to rule out fistula. Will discuss with Dr. Jena Gauss regarding CT vs MRI (given MRI last year for comparison). Ovary needs reevaluate as well.   Obtain copy of op note from last year for review as well.

## 2018-05-20 NOTE — Progress Notes (Signed)
Primary Care Physician: Juliette Alcide, MD  Primary Gastroenterologist:  Roetta Sessions, MD   Chief Complaint  Patient presents with  . Diarrhea    HPI: Melissa Fowler is a 52 y.o. female here for follow-up of chronic diarrhea.  She was seen back in March.  She has a history of IBS diarrhea, diverticulosis/diverticulitis. Treated at least 3-4 times in the past years for diverticulitis. Was sent to [place] back in March for consideration of colonoscopy because of an inflammatory mass with severe adhesive disease appear to be arising from sigmoid diverticular disease seen during mini laparotomy for exploration of left adnexa by Dr. Ralph Dowdy. Left ovary/cyst left untouched. She had extensive adhesions. Given lack of bowel prep for procedure, decision was made to leave bowel/ovary alone. F/u imaging advised as well as colonoscopy.   Colonoscopy performed May 2019, noted to have a noncompliant left colon requiring transition to a pediatric scope to complete exam, diverticulosis, nonbleeding internal hemorrhoids, terminal ileum looked normal, random colon biopsies negative.  She presents back today stating she has anywhere from 1 bowel movement to 10 bowel movements daily.  Stools are typically loose, rare solid stool.  No nocturnal stools.  She has intermittent left lower quadrant pain, feels like a shock.  Comes and goes quickly.  No fever.  If she eats greasy foods that she has multiple stools daily.  Over the past few weeks she has had a new symptom including passing air from her vagina, when she has diarrhea she has stool noted wiping from the front and the back.  She has had one episode of having to wear a pad for vaginal drainage which appeared to be stool but she is not sure.  Longer sexually active due to these issues and due to her chronic diarrhea.   Celiac serologies negative back in May.  Current Outpatient Medications  Medication Sig Dispense Refill  . Aspirin-Acetaminophen-Caffeine  (GOODYS EXTRA STRENGTH PO) Take by mouth as needed.    . diphenhydramine-acetaminophen (TYLENOL PM) 25-500 MG TABS tablet Take 1 tablet by mouth at bedtime as needed.     No current facility-administered medications for this visit.     Allergies as of 05/20/2018 - Review Complete 05/20/2018  Allergen Reaction Noted  . Codeine Itching   . Cyclobenzaprine hcl Other (See Comments) 12/07/2009  . Morphine and related  01/22/2018  . Nsaids Other (See Comments) 12/07/2009  . Metronidazole Rash    Past Surgical History:  Procedure Laterality Date  . BIOPSY  01/22/2018   Procedure: BIOPSY;  Surgeon: Corbin Ade, MD;  [facility];  Service: Endoscopy;;  ascending and descending/sigmoid  . BREATH HYDROGEN TEST  07/13/2011      . CHOLECYSTECTOMY    . COLONOSCOPY  03/2006   Minimal friable anal canal, fairly extensive left-sided and transverse diverticula, normal terminal ileum  . COLONOSCOPY  06/05/2011   Dr Marlowe Alt papilla, diverticulosis, bening random bx  . COLONOSCOPY N/A 01/22/2018   Rourk: Diverticulosis, internal hemorrhoids, noncompliant left colon, terminal ileum looked normal.  Random colon biopsies negative.  Next colonoscopy in 10 years.  Marland Kitchen FOOT SURGERY  2011/2012   bilateral  . HYDROGEN BREATH TEST  07/11/2011   Procedure: HYDROGEN BREATH TEST;  Surgeon: Corbin Ade, MD;  [facility]: [facility];  Service: Endoscopy;  Laterality: N/A;  7:30/for Bacterial Overgrowth  . PARTIAL HYSTERECTOMY    . TUBAL LIGATION      ROS:  General: Negative for anorexia, weight  loss, fever, chills, fatigue, weakness. ENT: Negative for hoarseness, difficulty swallowing , nasal congestion. CV: Negative for chest pain, angina, palpitations, dyspnea on exertion, peripheral edema.  Respiratory: Negative for dyspnea at rest, dyspnea on exertion, cough, sputum, wheezing.  GI: See history of present illness. GU:  Negative for dysuria, hematuria, urinary incontinence, urinary frequency,  nocturnal urination. See hpi. Endo: Negative for unusual weight change.    Physical Examination:   BP 113/80   Pulse 89   Temp 97.8 F (36.6 C) (Oral)   Ht 5' 5.5" (1.664 m)   Wt 166 lb (75.3 kg)   BMI 27.20 kg/m   General: Well-nourished, well-developed in no acute distress.  Eyes: No icterus. Mouth: Oropharyngeal mucosa moist and pink , no lesions erythema or exudate. Lungs: Clear to auscultation bilaterally.  Heart: Regular rate and rhythm, no murmurs rubs or gallops.  Abdomen: Bowel sounds are normal, nontender, nondistended, no hepatosplenomegaly or masses, no abdominal bruits or hernia , no rebound or guarding.   Extremities: No lower extremity edema. No clubbing or deformities. Neuro: Alert and oriented x 4   Skin: Warm and dry, no jaundice.   Psych: Alert and cooperative, normal mood and affect.   Imaging Studies: Viewed reports from PACS  Pelvic U/S 05/2017 2.6X2.2X3.1cm complex left ovarian cyst.   Pelvic U/S 07/2017 Ovary mostly replaced by complicated cyst, measuring 3.7X4.1X3.4cm with increase in size of cyst. Likely benign but MRI recommended.  MRI pelvis 07/2017 Mild diffuse wall thickening of urinary bladder, sigmoid diverticulosis noted. Mild diffuse sigmoid colonic wall thickening and pericolonic soft tissue stranding , c/w mild diverticulitis. No abscess. Complex cystic and solid mass seen in the left adnexa measuring 4.8 x 2.8 cm.  This shows an enhancing internal soft tissue component measuring 2.8 x 2.3 cm.  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  Surgical evaluation should be considered.

## 2018-05-20 NOTE — Progress Notes (Signed)
CC'D TO PCP °

## 2018-05-27 ENCOUNTER — Telehealth: Payer: Self-pay | Admitting: Gastroenterology

## 2018-05-27 DIAGNOSIS — N824 Other female intestinal-genital tract fistulae: Secondary | ICD-10-CM

## 2018-05-27 NOTE — Telephone Encounter (Signed)
Please let patient know that I discussed appropriate imaging with Dr. Jena Gauss as well as the radiologist.  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

## 2018-05-27 NOTE — Telephone Encounter (Signed)
CT scheduled for 06/11/18 at 2:00pm, arrival time 1:45pm, npo 4 hrs prior, p/u oral contrast prior to appt.  Called patient and made aware of appt details. She voiced understanding  Called [facility] and spoke with Erick. No PA is required for CT.

## 2018-05-27 NOTE — Addendum Note (Signed)
Addended by: Tommie Sams on: 05/27/2018 02:11 PM   Modules accepted: Orders

## 2018-05-27 NOTE — Telephone Encounter (Signed)
Noted pt notified of procedure needed. Please schedule.

## 2018-06-04 ENCOUNTER — Ambulatory Visit: Payer: Commercial Managed Care - PPO | Admitting: Nurse Practitioner

## 2018-06-11 ENCOUNTER — Ambulatory Visit (HOSPITAL_COMMUNITY)
Admission: RE | Admit: 2018-06-11 | Discharge: 2018-06-11 | Disposition: A | Discharge status: Home / Self Care | Payer: Commercial Managed Care - PPO | Source: Ambulatory Visit | Attending: Gastroenterology | Admitting: Gastroenterology

## 2018-06-11 DIAGNOSIS — I700 Atherosclerosis of aorta: Secondary | ICD-10-CM | POA: Diagnosis not present

## 2018-06-11 DIAGNOSIS — K5730 Diverticulosis of large intestine without perforation or abscess without bleeding: Secondary | ICD-10-CM | POA: Diagnosis not present

## 2018-06-11 DIAGNOSIS — N824 Other female intestinal-genital tract fistulae: Secondary | ICD-10-CM | POA: Insufficient documentation

## 2018-06-11 DIAGNOSIS — Z0389 Encounter for observation for other suspected diseases and conditions ruled out: Secondary | ICD-10-CM | POA: Diagnosis not present

## 2018-06-11 MED ORDER — IOPAMIDOL (ISOVUE-300) INJECTION 61%
100.0000 mL | Freq: Once | INTRAVENOUS | Status: AC | PRN
  Administered 2018-06-11: 100 mL via INTRAVENOUS

## 2018-06-16 ENCOUNTER — Other Ambulatory Visit: Payer: Self-pay

## 2018-06-16 DIAGNOSIS — N824 Other female intestinal-genital tract fistulae: Secondary | ICD-10-CM

## 2018-07-10 ENCOUNTER — Encounter: Payer: Self-pay | Admitting: General Surgery

## 2018-07-10 ENCOUNTER — Ambulatory Visit: Payer: Commercial Managed Care - PPO | Admitting: General Surgery

## 2018-07-10 VITALS — BP 131/79 | HR 88 | Temp 98.7°F | Resp 18 | Wt 170.0 lb

## 2018-07-10 DIAGNOSIS — N824 Other female intestinal-genital tract fistulae: Secondary | ICD-10-CM

## 2018-07-10 MED ORDER — PEG 3350-KCL-NABCB-NACL-NASULF 236 G PO SOLR: 4000.0000 mL | Freq: Once | ORAL | 0 refills | Status: AC

## 2018-07-10 MED ORDER — NEOMYCIN SULFATE 500 MG PO TABS: 1000.0000 mg | ORAL_TABLET | Freq: Three times a day (TID) | ORAL | 0 refills | Status: AC

## 2018-07-10 NOTE — Progress Notes (Signed)
Melissa Fowler; [id number]; 12/22/1965   HPI Patient is a 52-year-old white female who was referred to my care by Dr. 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  Patient states that she has had intermittent episodes of stool in her vagina with discharge.  She is also had intermittent episodes of diarrhea.  She is status post a hysterectomy in the remote past and a left oophorectomy.  She states she has had this problem intermittently for many months, but a recent CT scan of the abdomen showed a fistula between the sigmoid colon and the left vaginal cuff.  It seems like her symptoms are worsening.  She denies any pelvic pain.  No fever or chills have been noted.  She had a colonoscopy in May of this year. Past Medical History:  Diagnosis Date  . Diverticulitis   . GERD (gastroesophageal reflux disease)   . Irritable bowel syndrome     Past Surgical History:  Procedure Laterality Date  . BIOPSY  01/22/2018   Procedure: BIOPSY;  Surgeon: Rourk, Robert M, MD;  Location: [facility];  Service: Endoscopy;;  ascending and descending/sigmoid  . BREATH HYDROGEN TEST  07/13/2011      . CHOLECYSTECTOMY    . COLONOSCOPY  03/2006   Minimal friable anal canal, fairly extensive left-sided and transverse diverticula, normal terminal ileum  . COLONOSCOPY  06/05/2011   Dr Rourk-anal papilla, diverticulosis, bening random bx  . COLONOSCOPY N/A 01/22/2018   Rourk: Diverticulosis, internal hemorrhoids, noncompliant left colon, terminal ileum looked normal.  Random colon biopsies negative.  Next colonoscopy in 10 years.  . FOOT SURGERY  2011/2012   bilateral  . HYDROGEN BREATH TEST  07/11/2011   Procedure: HYDROGEN BREATH TEST;  Surgeon: Robert M Rourk, MD;  Location: [facility];  Service: Endoscopy;  Laterality: N/A;  7:30/for Bacterial Overgrowth  . PARTIAL HYSTERECTOMY    . TUBAL LIGATION      Family History  Problem Relation Age of Onset  . Colon cancer Maternal Aunt 48   . Aneurysm Mother 43       Deceased, brain  . Gastric cancer Neg Hx   . Esophageal cancer Neg Hx     Current Outpatient Medications on File Prior to Visit  Medication Sig Dispense Refill  . Aspirin-Acetaminophen-Caffeine (GOODYS EXTRA STRENGTH PO) Take by mouth as needed.    . colestipol (COLESTID) 1 g tablet Take 2 tablets (2 g total) by mouth daily. If develop constipation, hold for couple of days and restart at 1 tablet daily. 60 tablet 5  . diphenhydramine-acetaminophen (TYLENOL PM) 25-500 MG TABS tablet Take 1 tablet by mouth at bedtime as needed.     No current facility-administered medications on file prior to visit.     Allergies  Allergen Reactions  . Codeine Itching  . Cyclobenzaprine Hcl Other (See Comments)    REACTION: hallucinations  . Morphine And Related     Chest tightness  . Nsaids Other (See Comments)    REACTION: hallucinations  . Metronidazole Rash    Social History   Substance and Sexual Activity  Alcohol Use No  . Frequency: Never   Comment: very rare    Social History   Tobacco Use  Smoking Status Current Every Day Smoker  . Packs/day: 1.00  . Types: Cigarettes  Smokeless Tobacco Never Used    Review of Systems  Constitutional: Positive for malaise/fatigue.  HENT: Negative.   Eyes: Negative.   Respiratory: Negative.   Cardiovascular: Negative.     Gastrointestinal: Positive for heartburn.  Genitourinary: Negative.   Musculoskeletal: Negative.   Skin: Negative.   Neurological: Positive for dizziness.  Endo/Heme/Allergies: Negative.   Psychiatric/Behavioral: Negative.     Objective   Vitals:   07/10/18 0851  BP: 131/79  Pulse: 88  Resp: 18  Temp: 98.7 F (37.1 C)    Physical Exam  Constitutional: She is oriented to person, place, and time. She appears well-developed and well-nourished. No distress.  HENT:  Head: Normocephalic and atraumatic.  Cardiovascular: Normal rate, regular rhythm and normal heart sounds. Exam  reveals no gallop and no friction rub.  No murmur heard. Pulmonary/Chest: Effort normal and breath sounds normal. No stridor. No respiratory distress. She has no wheezes. She has no rales.  Abdominal: Soft. Bowel sounds are normal. She exhibits no mass. There is no tenderness. There is no rebound and no guarding.  Neurological: She is alert and oriented to person, place, and time.  Skin: Skin is warm and dry.  Vitals reviewed. Previous GI notes reviewed.  Previous colonoscopy report reviewed.  CT scan images personally reviewed.  Assessment  Colovaginal fistula Plan   Patient is scheduled for a partial colectomy on 07/21/2018.  The risks and benefits of the procedure including bleeding, infection, anastomotic leak, and the possibility of a colostomy were fully explained to the patient, who gave informed consent.  GoLYTELY and neomycin have been prescribed for preoperative bowel preparation.  Patient is allergic to Flagyl.  

## 2018-07-10 NOTE — H&P (Signed)
Melissa Fowler; [id number]; 1966/03/27   HPI Patient is a 52 year old white female who was referred to my care by Dr. 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  Patient states that she has had intermittent episodes of stool in her vagina with discharge.  She is also had intermittent episodes of diarrhea.  She is status post a hysterectomy in the remote past and a left oophorectomy.  She states she has had this problem intermittently for many months, but a recent CT scan of the abdomen showed a fistula between the sigmoid colon and the left vaginal cuff.  It seems like her symptoms are worsening.  She denies any pelvic pain.  No fever or chills have been noted.  She had a colonoscopy in May of this year. Past Medical History:  Diagnosis Date  . Diverticulitis   . GERD (gastroesophageal reflux disease)   . Irritable bowel syndrome     Past Surgical History:  Procedure Laterality Date  . BIOPSY  01/22/2018   Procedure: BIOPSY;  Surgeon: Corbin Ade, MD;  Location: [facility];  Service: Endoscopy;;  ascending and descending/sigmoid  . BREATH HYDROGEN TEST  07/13/2011      . CHOLECYSTECTOMY    . COLONOSCOPY  03/2006   Minimal friable anal canal, fairly extensive left-sided and transverse diverticula, normal terminal ileum  . COLONOSCOPY  06/05/2011   Dr Marlowe Alt papilla, diverticulosis, bening random bx  . COLONOSCOPY N/A 01/22/2018   Rourk: Diverticulosis, internal hemorrhoids, noncompliant left colon, terminal ileum looked normal.  Random colon biopsies negative.  Next colonoscopy in 10 years.  Marland Kitchen FOOT SURGERY  2011/2012   bilateral  . HYDROGEN BREATH TEST  07/11/2011   Procedure: HYDROGEN BREATH TEST;  Surgeon: Corbin Ade, MD;  Location: [facility];  Service: Endoscopy;  Laterality: N/A;  7:30/for Bacterial Overgrowth  . PARTIAL HYSTERECTOMY    . TUBAL LIGATION      Family History  Problem Relation Age of Onset  . Colon cancer Maternal Aunt 48   . Aneurysm Mother 24       Deceased, brain  . Gastric cancer Neg Hx   . Esophageal cancer Neg Hx     Current Outpatient Medications on File Prior to Visit  Medication Sig Dispense Refill  . Aspirin-Acetaminophen-Caffeine (GOODYS EXTRA STRENGTH PO) Take by mouth as needed.    . colestipol (COLESTID) 1 g tablet Take 2 tablets (2 g total) by mouth daily. If develop constipation, hold for couple of days and restart at 1 tablet daily. 60 tablet 5  . diphenhydramine-acetaminophen (TYLENOL PM) 25-500 MG TABS tablet Take 1 tablet by mouth at bedtime as needed.     No current facility-administered medications on file prior to visit.     Allergies  Allergen Reactions  . Codeine Itching  . Cyclobenzaprine Hcl Other (See Comments)    REACTION: hallucinations  . Morphine And Related     Chest tightness  . Nsaids Other (See Comments)    REACTION: hallucinations  . Metronidazole Rash    Social History   Substance and Sexual Activity  Alcohol Use No  . Frequency: Never   Comment: very rare    Social History   Tobacco Use  Smoking Status Current Every Day Smoker  . Packs/day: 1.00  . Types: Cigarettes  Smokeless Tobacco Never Used    Review of Systems  Constitutional: Positive for malaise/fatigue.  HENT: Negative.   Eyes: Negative.   Respiratory: Negative.   Cardiovascular: Negative.  Gastrointestinal: Positive for heartburn.  Genitourinary: Negative.   Musculoskeletal: Negative.   Skin: Negative.   Neurological: Positive for dizziness.  Endo/Heme/Allergies: Negative.   Psychiatric/Behavioral: Negative.     Objective   Vitals:   07/10/18 0851  BP: 131/79  Pulse: 88  Resp: 18  Temp: 98.7 F (37.1 C)    Physical Exam  Constitutional: She is oriented to person, place, and time. She appears well-developed and well-nourished. No distress.  HENT:  Head: Normocephalic and atraumatic.  Cardiovascular: Normal rate, regular rhythm and normal heart sounds. Exam  reveals no gallop and no friction rub.  No murmur heard. Pulmonary/Chest: Effort normal and breath sounds normal. No stridor. No respiratory distress. She has no wheezes. She has no rales.  Abdominal: Soft. Bowel sounds are normal. She exhibits no mass. There is no tenderness. There is no rebound and no guarding.  Neurological: She is alert and oriented to person, place, and time.  Skin: Skin is warm and dry.  Vitals reviewed. Previous GI notes reviewed.  Previous colonoscopy report reviewed.  CT scan images personally reviewed.  Assessment  Colovaginal fistula Plan   Patient is scheduled for a partial colectomy on 07/21/2018.  The risks and benefits of the procedure including bleeding, infection, anastomotic leak, and the possibility of a colostomy were fully explained to the patient, who gave informed consent.  GoLYTELY and neomycin have been prescribed for preoperative bowel preparation.  Patient is allergic to Flagyl.

## 2018-07-10 NOTE — Patient Instructions (Signed)
Open Colectomy An open colectomy is surgery to remove part or all of the large intestine (colon). This procedure may be used to treat several conditions, including:  Inflammation and infection of the colon (diverticulitis).  Tumors or masses in the colon.  Inflammatory bowel disease, such as Crohn disease or ulcerative colitis.  Bleeding from the colon.  Blockage or obstruction of the colon.  Tell a health care provider about:  Any allergies you have.  All medicines you are taking, including vitamins, herbs, eye drops, creams, and over-the-counter medicines.  Any problems you or family members have had with anesthetic medicines.  Any blood disorders you have.  Any surgeries you have had.  Any medical conditions you have.  Whether you are pregnant or may be pregnant.  Whether you smoke or use tobacco products. These can affect your body's reaction to anesthesia. What are the risks? Generally, this is a safe procedure. However, problems may occur, including:  Infection.  Bleeding.  Allergic reactions to medicines.  Damage to other structures or organs.  Pneumonia.  The incision opening up.  Tissues from inside the abdomen bulging through the incision (hernia).  Reopening of the colon where it was stitched or stapled together.  A blood clot forming in a vein and traveling to the lungs.  Future blockage of the small intestine from scar tissue.  What happens before the procedure? Staying hydrated Follow instructions from your health care provider about hydration, which may include:  Up to 2 hours before the procedure - you may continue to drink clear liquids, such as water, clear fruit juice, black coffee, and plain tea.  Eating and drinking restrictions Follow instructions from your health care provider about eating and drinking, which may include:  8 hours before the procedure - stop eating heavy meals or foods such as meat, fried foods, or fatty foods.  6  hours before the procedure - stop eating light meals or foods, such as toast or cereal.  6 hours before the procedure - stop drinking milk or drinks that contain milk.  2 hours before the procedure - stop drinking clear liquids.  Bowel prep In some cases, you may be prescribed an oral bowel prep to clean out your colon. If so:  Take it as told by your health care provider. Starting the day before your procedure, you may need to drink a large amount of medicated liquid. The liquid will cause you to have multiple loose stools until your stool is almost clear or light green.  Follow instructions from your health care provider about eating and drinking restrictions during bowel prep.  Medicines  Ask your health care provider about: ? Changing or stopping your regular medicines or vitamins. This is especially important if you are taking diabetes medicines, blood thinners, or vitamin E. ? Taking medicines such as aspirin and ibuprofen. These medicines can thin your blood. Do not take these medicines before your procedure if your health care provider instructs you not to.  If you were prescribed an antibiotic medicine, take it as told by your health care provider. General instructions  Bring loose-fitting, comfortable clothing and slip-on shoes that you can put on without bending over.  Make sure to see your health care provider for any tests that you need before the procedure, such as: ? Blood tests. ? A test to check the heart's rhythm (electrocardiogram, ECG). ? A CT scan of your abdomen. ? Urine tests. ? Colonoscopy.  Plan to have someone take you home from the   hospital or clinic.  Arrange for someone to help you with your activities during your recovery. What happens during the procedure?  To reduce your risk of infection: ? Your health care team will wash or sanitize their hands. ? Your skin will be washed with soap. ? Hair may be removed from the surgical area.  An IV tube  will be inserted into one of your veins. The tube will be used to give you medicines and fluids.  You will be given a medicine to make you fall asleep (general anesthetic). You may also be given a medicine to help you relax (sedative).  Small monitors will be connected to your body. They will be used to check your heart, blood pressure, and oxygen level.  A breathing tube may be placed into your lungs during the procedure.  A thin, flexible tube (catheter) will be placed into your bladder to drain urine.  A tube may be inserted through your nose and into your stomach (nasogastric tube, or NG tube). The tube is used to remove stomach fluids after surgery until the intestines start working again.  An incision will be made in your abdomen.  Clamps or staples will be put on your colon.  The part of the colon between the clamps or staples will be removed.  The ends of the colon that remain will be stitched or stapled together.  The incision in your abdomen will be closed with stitches (sutures) or staples.  The incision will be covered with a bandage (dressing).  A small opening (stoma) may be created in your lower abdomen. A removable, external pouch (ostomy pouch) will be attached to the stoma. This pouch will collect stool outside of your body. Stool passes through the stoma and into the pouch instead of through your anus. The procedure may vary among health care providers and hospitals. What happens after the procedure?  Your blood pressure, heart rate, breathing rate, and blood oxygen level will be monitored until the medicines you were given have worn off.  You may continue to receive fluids and medicines through an IV tube.  You will start on a clear liquid diet and gradually go back to a normal diet.  Do not drive until your health care provider approves.  You may have some pain in your abdomen. You will be given pain medicine to control the pain.  You will be encouraged to  do the following: ? Do breathing exercises to prevent pneumonia. ? Get up and start walking within a day after surgery. You should try to get up 5-6 times a day. This information is not intended to replace advice given to you by your health care provider. Make sure you discuss any questions you have with your health care provider. Document Released: 07/01/2009 Document Revised: 06/04/2016 Document Reviewed: 06/04/2016 [employer] Interactive Patient Education  2018 [employer]  

## 2018-07-15 NOTE — Patient Instructions (Signed)
Melissa Fowler  07/15/2018     @PREFPERIOPPHARMACY @   Your procedure is scheduled on  07/21/2018 .  Report to [name] at   615   A.M.  Call this number if you have problems the morning of surgery:  [phone]   Remember:  Do not eat or drink after midnight.                         Take these medicines the morning of surgery with A SIP OF WATER  None    Do not wear jewelry, make-up or nail polish.  Do not wear lotions, powders, or perfumes, or deodorant.  Do not shave 48 hours prior to surgery.  Men may shave face and neck.  Do not bring valuables to the hospital.  [facility] is not responsible for any belongings or valuables.  Contacts, dentures or bridgework may not be worn into surgery.  Leave your suitcase in the car.  After surgery it may be brought to your room.  For patients admitted to the hospital, discharge time will be determined by your treatment team.  Patients discharged the day of surgery will not be allowed to drive home.   Name and phone number of your driver:   family Special instructions:  Follow the diet and prep instructions given to you by [name] office.  Please read over the following fact sheets that you were given. Pain Booklet, Coughing and Deep Breathing, Blood Transfusion Information, Lab Information, MRSA Information, Surgical Site Infection Prevention, Anesthesia Post-op Instructions and Care and Recovery After Surgery       Open Colectomy An open colectomy is surgery to remove part or all of the large intestine (colon). This procedure may be used to treat several conditions, including:  Inflammation and infection of the colon (diverticulitis).  Tumors or masses in the colon.  Inflammatory bowel disease, such as Crohn disease or ulcerative colitis.  Bleeding from the colon.  Blockage or obstruction of the colon.  Tell a health care provider about:  Any allergies you have.  All medicines you are taking,  including vitamins, herbs, eye drops, creams, and over-the-counter medicines.  Any problems you or family members have had with anesthetic medicines.  Any blood disorders you have.  Any surgeries you have had.  Any medical conditions you have.  Whether you are pregnant or may be pregnant.  Whether you smoke or use tobacco products. These can affect your body's reaction to anesthesia. What are the risks? Generally, this is a safe procedure. However, problems may occur, including:  Infection.  Bleeding.  Allergic reactions to medicines.  Damage to other structures or organs.  Pneumonia.  The incision opening up.  Tissues from inside the abdomen bulging through the incision (hernia).  Reopening of the colon where it was stitched or stapled together.  A blood clot forming in a vein and traveling to the lungs.  Future blockage of the small intestine from scar tissue.  What happens before the procedure? Staying hydrated Follow instructions from your health care provider about hydration, which may include:  Up to 2 hours before the procedure - you may continue to drink clear liquids, such as water, clear fruit juice, black coffee, and plain tea.  Eating and drinking restrictions Follow instructions from your health care provider about eating and drinking, which may include:  8 hours before the procedure - stop eating heavy meals  or foods such as meat, fried foods, or fatty foods.  6 hours before the procedure - stop eating light meals or foods, such as toast or cereal.  6 hours before the procedure - stop drinking milk or drinks that contain milk.  2 hours before the procedure - stop drinking clear liquids.  Bowel prep In some cases, you may be prescribed an oral bowel prep to clean out your colon. If so:  Take it as told by your health care provider. Starting the day before your procedure, you may need to drink a large amount of medicated liquid. The liquid will  cause you to have multiple loose stools until your stool is almost clear or light green.  Follow instructions from your health care provider about eating and drinking restrictions during bowel prep.  Medicines  Ask your health care provider about: ? Changing or stopping your regular medicines or vitamins. This is especially important if you are taking diabetes medicines, blood thinners, or vitamin E. ? Taking medicines such as aspirin and ibuprofen. These medicines can thin your blood. Do not take these medicines before your procedure if your health care provider instructs you not to.  If you were prescribed an antibiotic medicine, take it as told by your health care provider. General instructions  Bring loose-fitting, comfortable clothing and slip-on shoes that you can put on without bending over.  Make sure to see your health care provider for any tests that you need before the procedure, such as: ? Blood tests. ? A test to check the heart's rhythm (electrocardiogram, ECG). ? A CT scan of your abdomen. ? Urine tests. ? Colonoscopy.  Plan to have someone take you home from the hospital or clinic.  Arrange for someone to help you with your activities during your recovery. What happens during the procedure?  To reduce your risk of infection: ? Your health care team will wash or sanitize their hands. ? Your skin will be washed with soap. ? Hair may be removed from the surgical area.  An IV tube will be inserted into one of your veins. The tube will be used to give you medicines and fluids.  You will be given a medicine to make you fall asleep (general anesthetic). You may also be given a medicine to help you relax (sedative).  Small monitors will be connected to your body. They will be used to check your heart, blood pressure, and oxygen level.  A breathing tube may be placed into your lungs during the procedure.  A thin, flexible tube (catheter) will be placed into your bladder  to drain urine.  A tube may be inserted through your nose and into your stomach (nasogastric tube, or NG tube). The tube is used to remove stomach fluids after surgery until the intestines start working again.  An incision will be made in your abdomen.  Clamps or staples will be put on your colon.  The part of the colon between the clamps or staples will be removed.  The ends of the colon that remain will be stitched or stapled together.  The incision in your abdomen will be closed with stitches (sutures) or staples.  The incision will be covered with a bandage (dressing).  A small opening (stoma) may be created in your lower abdomen. A removable, external pouch (ostomy pouch) will be attached to the stoma. This pouch will collect stool outside of your body. Stool passes through the stoma and into the pouch instead of through your anus.  The procedure may vary among health care providers and hospitals. What happens after the procedure?  Your blood pressure, heart rate, breathing rate, and blood oxygen level will be monitored until the medicines you were given have worn off.  You may continue to receive fluids and medicines through an IV tube.  You will start on a clear liquid diet and gradually go back to a normal diet.  Do not drive until your health care provider approves.  You may have some pain in your abdomen. You will be given pain medicine to control the pain.  You will be encouraged to do the following: ? Do breathing exercises to prevent pneumonia. ? Get up and start walking within a day after surgery. You should try to get up 5-6 times a day. This information is not intended to replace advice given to you by your health care provider. Make sure you discuss any questions you have with your health care provider. Document Released: 07/01/2009 Document Revised: 06/04/2016 Document Reviewed: 06/04/2016 Elsevier Interactive Patient Education  2018 [employer].  Open  Colectomy, Care After This sheet gives you information about how to care for yourself after your procedure. Your health care provider may also give you more specific instructions. If you have problems or questions, contact your health care provider. What can I expect after the procedure? After the procedure, it is common to have:  Pain in your abdomen, especially along your incision.  Tiredness. Your energy level will return to normal over the next several weeks.  Constipation.  Nausea.  Difficulty urinating.  Follow these instructions at home: Activity  You may be able to return to most of your normal activities within 1-2 weeks, such as working, walking up stairs, and sexual activity.  Avoid activities that require a lot of energy for 4-6 weeks after surgery, such as running, climbing, and lifting heavy objects. Ask your health care provider what activities are safe for you.  Take rest breaks during the day as needed.  Do not drive for 1-2 weeks or until your health care provider says that it is safe.  Do not drive or use heavy machinery while taking prescription pain medicines.  Do not lift anything that is heavier than 10 lb (4.3 kg) until your health care provider says that it is safe. Incision care  Follow instructions from your health care provider about how to take care of your incision. Make sure you: ? Wash your hands with soap and water before you change your bandage (dressing). If soap and water are not available, use hand sanitizer. ? Change your dressing as told by your health care provider. ? Leave stitches (sutures) or staples in place. These skin closures may need to stay in place for 2 weeks or longer.  Avoid wearing tight clothing around your incision.  Protect your incision area from the sun.  Check your incision area every day for signs of infection. Check for: ? More redness, swelling, or pain. ? More fluid or blood. ? Warmth. ? Pus or a bad  smell. General instructions  Do not take baths, swim, or use a hot tub until your health care provider approves. Ask your health care provider when you may shower.  Take over-the-counter and prescription medicines, including stool softeners, only as told by your health care provider.  Eat a low-fat and low-fiber diet for the first 4 weeks after surgery.  Keep all follow-up visits as told by your health care provider. This is important. Contact a health  care provider if:  You have more redness, swelling, or pain around your incision.  You have more fluid or blood coming from your incision.  Your incision feels warm to the touch.  You have pus or a bad smell coming from your incision.  You have a fever or chills.  You do not have a bowel movement 2-3 days after surgery.  You cannot eat or drink for 24 hours or more.  You have persistent nausea and vomiting.  You have abdominal pain that gets worse and does not get better with medicine. Get help right away if:  You have chest pain.  You have shortness of breath.  You have pain or swelling in your legs.  Your incision breaks open after your sutures or staples have been removed.  You have bleeding from the rectum. This information is not intended to replace advice given to you by your health care provider. Make sure you discuss any questions you have with your health care provider. Document Released: 03/27/2011 Document Revised: 06/04/2016 Document Reviewed: 06/04/2016 Elsevier Interactive Patient Education  2018 [employer].  General Anesthesia, Adult General anesthesia is the use of medicines to make a person "go to sleep" (be unconscious) for a medical procedure. General anesthesia is often recommended when a procedure:  Is long.  Requires you to be still or in an unusual position.  Is major and can cause you to lose blood.  Is impossible to do without general anesthesia.  The medicines used for general  anesthesia are called general anesthetics. In addition to making you sleep, the medicines:  Prevent pain.  Control your blood pressure.  Relax your muscles.  Tell a health care provider about:  Any allergies you have.  All medicines you are taking, including vitamins, herbs, eye drops, creams, and over-the-counter medicines.  Any problems you or family members have had with anesthetic medicines.  Types of anesthetics you have had in the past.  Any bleeding disorders you have.  Any surgeries you have had.  Any medical conditions you have.  Any history of heart or lung conditions, such as heart failure, sleep apnea, or chronic obstructive pulmonary disease (COPD).  Whether you are pregnant or may be pregnant.  Whether you use tobacco, alcohol, marijuana, or street drugs.  Any history of Financial [employer].  Any history of depression or anxiety. What are the risks? Generally, this is a safe procedure. However, problems may occur, including:  Allergic reaction to anesthetics.  Lung and heart problems.  Inhaling food or liquids from your stomach into your lungs (aspiration).  Injury to nerves.  Waking up during your procedure and being unable to move (rare).  Extreme agitation or a state of mental confusion (delirium) when you wake up from the anesthetic.  Air in the bloodstream, which can lead to stroke.  These problems are more likely to develop if you are having a major surgery or if you have an advanced medical condition. You can prevent some of these complications by answering all of your health care provider's questions thoroughly and by following all pre-procedure instructions. General anesthesia can cause side effects, including:  Nausea or vomiting  A sore throat from the breathing tube.  Feeling cold or shivery.  Feeling tired, washed out, or achy.  Sleepiness or drowsiness.  Confusion or agitation.  What happens before the procedure? Staying  hydrated Follow instructions from your health care provider about hydration, which may include:  Up to 2 hours before the procedure - you may  continue to drink clear liquids, such as water, clear fruit juice, black coffee, and plain tea.  Eating and drinking restrictions Follow instructions from your health care provider about eating and drinking, which may include:  8 hours before the procedure - stop eating heavy meals or foods such as meat, fried foods, or fatty foods.  6 hours before the procedure - stop eating light meals or foods, such as toast or cereal.  6 hours before the procedure - stop drinking milk or drinks that contain milk.  2 hours before the procedure - stop drinking clear liquids.  Medicines  Ask your health care provider about: ? Changing or stopping your regular medicines. This is especially important if you are taking diabetes medicines or blood thinners. ? Taking medicines such as aspirin and ibuprofen. These medicines can thin your blood. Do not take these medicines before your procedure if your health care provider instructs you not to. ? Taking new dietary supplements or medicines. Do not take these during the week before your procedure unless your health care provider approves them.  If you are told to take a medicine or to continue taking a medicine on the day of the procedure, take the medicine with sips of water. General instructions   Ask if you will be going home the same day, the following day, or after a longer hospital stay. ? Plan to have someone take you home. ? Plan to have someone stay with you for the first 24 hours after you leave the hospital or clinic.  For 3-6 weeks before the procedure, try not to use any tobacco products, such as cigarettes, chewing tobacco, and e-cigarettes.  You may brush your teeth on the morning of the procedure, but make sure to spit out the toothpaste. What happens during the procedure?  You will be given  anesthetics through a mask and through an IV tube in one of your veins.  You may receive medicine to help you relax (sedative).  As soon as you are asleep, a breathing tube may be used to help you breathe.  An anesthesia specialist will stay with you throughout the procedure. He or she will help keep you comfortable and safe by continuing to give you medicines and adjusting the amount of medicine that you get. He or she will also watch your blood pressure, pulse, and oxygen levels to make sure that the anesthetics do not cause any problems.  If a breathing tube was used to help you breathe, it will be removed before you wake up. The procedure may vary among health care providers and hospitals. What happens after the procedure?  You will wake up, often slowly, after the procedure is complete, usually in a recovery area.  Your blood pressure, heart rate, breathing rate, and blood oxygen level will be monitored until the medicines you were given have worn off.  You may be given medicine to help you calm down if you feel anxious or agitated.  If you will be going home the same day, your health care provider may check to make sure you can stand, drink, and urinate.  Your health care providers will treat your pain and side effects before you go home.  Do not drive for 24 hours if you received a sedative.  You may: ? Feel nauseous and vomit. ? Have a sore throat. ? Have mental slowness. ? Feel cold or shivery. ? Feel sleepy. ? Feel tired. ? Feel sore or achy, even in parts of your body  where you did not have surgery. This information is not intended to replace advice given to you by your health care provider. Make sure you discuss any questions you have with your health care provider. Document Released: 12/11/2007 Document Revised: 02/14/2016 Document Reviewed: 08/18/2015 Elsevier Interactive Patient Education  2018 [employer]. General Anesthesia, Adult, Care After These instructions  provide you with information about caring for yourself after your procedure. Your health care provider may also give you more specific instructions. Your treatment has been planned according to current medical practices, but problems sometimes occur. Call your health care provider if you have any problems or questions after your procedure. What can I expect after the procedure? After the procedure, it is common to have:  Vomiting.  A sore throat.  Mental slowness.  It is common to feel:  Nauseous.  Cold or shivery.  Sleepy.  Tired.  Sore or achy, even in parts of your body where you did not have surgery.  Follow these instructions at home: For at least 24 hours after the procedure:  Do not: ? Participate in activities where you could fall or become injured. ? Drive. ? Use heavy machinery. ? Drink alcohol. ? Take sleeping pills or medicines that cause drowsiness. ? Make important decisions or sign legal documents. ? Take care of children on your own.  Rest. Eating and drinking  If you vomit, drink water, juice, or soup when you can drink without vomiting.  Drink enough fluid to keep your urine clear or pale yellow.  Make sure you have little or no nausea before eating solid foods.  Follow the diet recommended by your health care provider. General instructions  Have a responsible adult stay with you until you are awake and alert.  Return to your normal activities as told by your health care provider. Ask your health care provider what activities are safe for you.  Take over-the-counter and prescription medicines only as told by your health care provider.  If you smoke, do not smoke without supervision.  Keep all follow-up visits as told by your health care provider. This is important. Contact a health care provider if:  You continue to have nausea or vomiting at home, and medicines are not helpful.  You cannot drink fluids or start eating again.  You cannot  urinate after 8-12 hours.  You develop a skin rash.  You have fever.  You have increasing redness at the site of your procedure. Get help right away if:  You have difficulty breathing.  You have chest pain.  You have unexpected bleeding.  You feel that you are having a life-threatening or urgent problem. This information is not intended to replace advice given to you by your health care provider. Make sure you discuss any questions you have with your health care provider. Document Released: 12/10/2000 Document Revised: 02/06/2016 Document Reviewed: 08/18/2015 Elsevier Interactive Patient Education  [employer].

## 2018-07-16 ENCOUNTER — Encounter (HOSPITAL_COMMUNITY)
Admission: RE | Admit: 2018-07-16 | Discharge: 2018-07-16 | Disposition: A | Discharge status: Home / Self Care | Payer: Commercial Managed Care - PPO | Source: Ambulatory Visit | Attending: General Surgery | Admitting: General Surgery

## 2018-07-16 ENCOUNTER — Other Ambulatory Visit: Payer: Self-pay

## 2018-07-16 ENCOUNTER — Encounter (HOSPITAL_COMMUNITY): Payer: Self-pay

## 2018-07-16 DIAGNOSIS — Z01812 Encounter for preprocedural laboratory examination: Secondary | ICD-10-CM | POA: Diagnosis not present

## 2018-07-16 HISTORY — DX: Unspecified osteoarthritis, unspecified site: M19.90

## 2018-07-16 LAB — TYPE AND SCREEN
ABO/RH(D): O POS
ANTIBODY SCREEN: NEGATIVE

## 2018-07-16 LAB — CBC WITH DIFFERENTIAL/PLATELET
Abs Immature Granulocytes: 0.03 K/uL (ref 0.00–0.07)
Basophils Absolute: 0.1 K/uL (ref 0.0–0.1)
Basophils Relative: 1 %
Eosinophils Absolute: 0.1 K/uL (ref 0.0–0.5)
Eosinophils Relative: 1 %
HCT: 38 % (ref 36.0–46.0)
Hemoglobin: 12.1 g/dL (ref 12.0–15.0)
Immature Granulocytes: 0 %
Lymphocytes Relative: 35 %
Lymphs Abs: 3.1 K/uL (ref 0.7–4.0)
MCH: 31.3 pg (ref 26.0–34.0)
MCHC: 31.8 g/dL (ref 30.0–36.0)
MCV: 98.4 fL (ref 80.0–100.0)
Monocytes Absolute: 0.4 K/uL (ref 0.1–1.0)
Monocytes Relative: 5 %
Neutro Abs: 5.1 K/uL (ref 1.7–7.7)
Neutrophils Relative %: 58 %
Platelets: 406 K/uL — ABNORMAL HIGH (ref 150–400)
RBC: 3.86 MIL/uL — ABNORMAL LOW (ref 3.87–5.11)
RDW: 13.6 % (ref 11.5–15.5)
WBC: 8.8 K/uL (ref 4.0–10.5)
nRBC: 0 % (ref 0.0–0.2)

## 2018-07-16 LAB — BASIC METABOLIC PANEL WITH GFR
Anion gap: 11 (ref 5–15)
BUN: 13 mg/dL (ref 6–20)
CO2: 18 mmol/L — ABNORMAL LOW (ref 22–32)
Calcium: 9.3 mg/dL (ref 8.9–10.3)
Chloride: 109 mmol/L (ref 98–111)
Creatinine, Ser: 0.65 mg/dL (ref 0.44–1.00)
GFR calc Af Amer: >60 mL/min
GFR calc non Af Amer: >60 mL/min
Glucose, Bld: 112 mg/dL — ABNORMAL HIGH (ref 70–99)
Potassium: 3.9 mmol/L (ref 3.5–5.1)
Sodium: 138 mmol/L (ref 135–145)

## 2018-07-21 ENCOUNTER — Encounter (HOSPITAL_COMMUNITY)
Admission: RE | Disposition: A | Discharge status: Home / Self Care | Payer: Self-pay | Source: Home / Self Care | Attending: General Surgery

## 2018-07-21 ENCOUNTER — Inpatient Hospital Stay (HOSPITAL_COMMUNITY): Payer: Commercial Managed Care - PPO | Admitting: Registered Nurse

## 2018-07-21 ENCOUNTER — Encounter (HOSPITAL_COMMUNITY): Payer: Self-pay | Admitting: *Deleted

## 2018-07-21 ENCOUNTER — Other Ambulatory Visit: Payer: Self-pay

## 2018-07-21 ENCOUNTER — Inpatient Hospital Stay (HOSPITAL_COMMUNITY)
Admission: RE | Admit: 2018-07-21 | Discharge: 2018-07-25 | DRG: 330 | Disposition: A | Discharge status: Home / Self Care | Payer: Commercial Managed Care - PPO | Attending: General Surgery | Admitting: General Surgery

## 2018-07-21 DIAGNOSIS — K5732 Diverticulitis of large intestine without perforation or abscess without bleeding: Secondary | ICD-10-CM | POA: Diagnosis present

## 2018-07-21 DIAGNOSIS — Z888 Allergy status to other drugs, medicaments and biological substances status: Secondary | ICD-10-CM | POA: Diagnosis not present

## 2018-07-21 DIAGNOSIS — Z886 Allergy status to analgesic agent status: Secondary | ICD-10-CM | POA: Diagnosis not present

## 2018-07-21 DIAGNOSIS — Z885 Allergy status to narcotic agent status: Secondary | ICD-10-CM | POA: Diagnosis not present

## 2018-07-21 DIAGNOSIS — K5720 Diverticulitis of large intestine with perforation and abscess without bleeding: Secondary | ICD-10-CM | POA: Diagnosis not present

## 2018-07-21 DIAGNOSIS — Z90710 Acquired absence of both cervix and uterus: Secondary | ICD-10-CM | POA: Diagnosis not present

## 2018-07-21 DIAGNOSIS — Z90721 Acquired absence of ovaries, unilateral: Secondary | ICD-10-CM | POA: Diagnosis not present

## 2018-07-21 DIAGNOSIS — N824 Other female intestinal-genital tract fistulae: Secondary | ICD-10-CM | POA: Diagnosis not present

## 2018-07-21 DIAGNOSIS — Z883 Allergy status to other anti-infective agents status: Secondary | ICD-10-CM | POA: Diagnosis not present

## 2018-07-21 DIAGNOSIS — K219 Gastro-esophageal reflux disease without esophagitis: Secondary | ICD-10-CM | POA: Diagnosis present

## 2018-07-21 DIAGNOSIS — K589 Irritable bowel syndrome without diarrhea: Secondary | ICD-10-CM | POA: Diagnosis present

## 2018-07-21 DIAGNOSIS — Z9049 Acquired absence of other specified parts of digestive tract: Secondary | ICD-10-CM

## 2018-07-21 DIAGNOSIS — F17210 Nicotine dependence, cigarettes, uncomplicated: Secondary | ICD-10-CM | POA: Diagnosis present

## 2018-07-21 DIAGNOSIS — N823 Fistula of vagina to large intestine: Principal | ICD-10-CM | POA: Diagnosis present

## 2018-07-21 HISTORY — PX: PARTIAL COLECTOMY: SHX5273

## 2018-07-21 SURGERY — COLECTOMY, PARTIAL
Anesthesia: General | Site: Abdomen

## 2018-07-21 MED ORDER — KETOROLAC TROMETHAMINE 30 MG/ML IJ SOLN
30.0000 mg | Freq: Once | INTRAMUSCULAR | Status: AC | PRN
  Administered 2018-07-21: 30 mg via INTRAVENOUS
  Filled 2018-07-21: qty 1

## 2018-07-21 MED ORDER — POVIDONE-IODINE 10 % OINT PACKET
TOPICAL_OINTMENT | CUTANEOUS | Status: DC | PRN
  Administered 2018-07-21: 1 via TOPICAL

## 2018-07-21 MED ORDER — SODIUM CHLORIDE 0.9 % IV SOLN
INTRAVENOUS | Status: DC
  Administered 2018-07-21 – 2018-07-22 (×2): via INTRAVENOUS

## 2018-07-21 MED ORDER — LIDOCAINE HCL (PF) 1 % IJ SOLN
INTRAMUSCULAR | Status: AC
  Filled 2018-07-21: qty 10

## 2018-07-21 MED ORDER — ROCURONIUM BROMIDE 50 MG/5ML IV SOLN
INTRAVENOUS | Status: AC
  Filled 2018-07-21: qty 1

## 2018-07-21 MED ORDER — SUGAMMADEX SODIUM 200 MG/2ML IV SOLN
INTRAVENOUS | Status: AC
  Filled 2018-07-21: qty 2

## 2018-07-21 MED ORDER — FENTANYL CITRATE (PF) 250 MCG/5ML IJ SOLN
INTRAMUSCULAR | Status: AC
  Filled 2018-07-21: qty 5

## 2018-07-21 MED ORDER — BUPIVACAINE LIPOSOME 1.3 % IJ SUSP
INTRAMUSCULAR | Status: DC | PRN
  Administered 2018-07-21: 20 mL

## 2018-07-21 MED ORDER — CHLORHEXIDINE GLUCONATE CLOTH 2 % EX PADS: 6.0000 | MEDICATED_PAD | Freq: Once | CUTANEOUS | Status: DC

## 2018-07-21 MED ORDER — TRAMADOL HCL 50 MG PO TABS
50.0000 mg | ORAL_TABLET | Freq: Four times a day (QID) | ORAL | Status: DC | PRN
  Administered 2018-07-21 – 2018-07-25 (×7): 50 mg via ORAL
  Filled 2018-07-21 (×7): qty 1

## 2018-07-21 MED ORDER — ALVIMOPAN 12 MG PO CAPS
12.0000 mg | ORAL_CAPSULE | ORAL | Status: AC
  Administered 2018-07-21: 12 mg via ORAL
  Filled 2018-07-21: qty 1

## 2018-07-21 MED ORDER — ACETAMINOPHEN 325 MG PO TABS
650.0000 mg | ORAL_TABLET | Freq: Four times a day (QID) | ORAL | Status: DC | PRN
  Administered 2018-07-21: 650 mg via ORAL
  Filled 2018-07-21: qty 2

## 2018-07-21 MED ORDER — FENTANYL CITRATE (PF) 100 MCG/2ML IJ SOLN
50.0000 ug | INTRAMUSCULAR | Status: DC | PRN
  Administered 2018-07-21 – 2018-07-24 (×16): 50 ug via INTRAVENOUS
  Filled 2018-07-21 (×17): qty 2

## 2018-07-21 MED ORDER — 0.9 % SODIUM CHLORIDE (POUR BTL) OPTIME
TOPICAL | Status: DC | PRN
  Administered 2018-07-21: 2000 mL

## 2018-07-21 MED ORDER — FENTANYL CITRATE (PF) 250 MCG/5ML IJ SOLN
INTRAMUSCULAR | Status: DC | PRN
  Administered 2018-07-21: 100 ug via INTRAVENOUS
  Administered 2018-07-21 (×3): 50 ug via INTRAVENOUS

## 2018-07-21 MED ORDER — MEPERIDINE HCL 50 MG/ML IJ SOLN: 6.2500 mg | INTRAMUSCULAR | Status: DC | PRN

## 2018-07-21 MED ORDER — MIDAZOLAM HCL 2 MG/2ML IJ SOLN
INTRAMUSCULAR | Status: AC
  Filled 2018-07-21: qty 2

## 2018-07-21 MED ORDER — SODIUM CHLORIDE 0.9 % IV SOLN
2.0000 g | INTRAVENOUS | Status: AC
  Administered 2018-07-21: 2 g via INTRAVENOUS
  Filled 2018-07-21: qty 2

## 2018-07-21 MED ORDER — HYDROMORPHONE HCL 1 MG/ML IJ SOLN
0.2500 mg | INTRAMUSCULAR | Status: DC | PRN
  Administered 2018-07-21: 0.5 mg via INTRAVENOUS
  Filled 2018-07-21: qty 0.5

## 2018-07-21 MED ORDER — ONDANSETRON HCL 4 MG/2ML IJ SOLN
4.0000 mg | Freq: Four times a day (QID) | INTRAMUSCULAR | Status: DC | PRN
  Administered 2018-07-22 – 2018-07-23 (×4): 4 mg via INTRAVENOUS
  Filled 2018-07-21 (×4): qty 2

## 2018-07-21 MED ORDER — ROCURONIUM BROMIDE 10 MG/ML (PF) SYRINGE
PREFILLED_SYRINGE | INTRAVENOUS | Status: DC | PRN
  Administered 2018-07-21: 50 mg via INTRAVENOUS
  Administered 2018-07-21: 10 mg via INTRAVENOUS

## 2018-07-21 MED ORDER — DIPHENHYDRAMINE HCL 50 MG/ML IJ SOLN: 25.0000 mg | Freq: Four times a day (QID) | INTRAMUSCULAR | Status: DC | PRN

## 2018-07-21 MED ORDER — DIPHENHYDRAMINE HCL 25 MG PO CAPS: 25.0000 mg | ORAL_CAPSULE | Freq: Four times a day (QID) | ORAL | Status: DC | PRN

## 2018-07-21 MED ORDER — ONDANSETRON 4 MG PO TBDP: 4.0000 mg | ORAL_TABLET | Freq: Four times a day (QID) | ORAL | Status: DC | PRN

## 2018-07-21 MED ORDER — DEXAMETHASONE SODIUM PHOSPHATE 4 MG/ML IJ SOLN
INTRAMUSCULAR | Status: AC
  Filled 2018-07-21: qty 1

## 2018-07-21 MED ORDER — PHENYLEPHRINE 40 MCG/ML (10ML) SYRINGE FOR IV PUSH (FOR BLOOD PRESSURE SUPPORT)
PREFILLED_SYRINGE | INTRAVENOUS | Status: DC | PRN
  Administered 2018-07-21 (×2): 80 ug via INTRAVENOUS

## 2018-07-21 MED ORDER — MIDAZOLAM HCL 5 MG/5ML IJ SOLN
INTRAMUSCULAR | Status: DC | PRN
  Administered 2018-07-21: 2 mg via INTRAVENOUS

## 2018-07-21 MED ORDER — PROPOFOL 10 MG/ML IV BOLUS
INTRAVENOUS | Status: DC | PRN
  Administered 2018-07-21: 150 mg via INTRAVENOUS

## 2018-07-21 MED ORDER — ONDANSETRON HCL 4 MG/2ML IJ SOLN: 4.0000 mg | Freq: Once | INTRAMUSCULAR | Status: DC | PRN

## 2018-07-21 MED ORDER — ENOXAPARIN SODIUM 40 MG/0.4ML ~~LOC~~ SOLN
40.0000 mg | SUBCUTANEOUS | Status: DC
  Administered 2018-07-22 – 2018-07-23 (×2): 40 mg via SUBCUTANEOUS
  Filled 2018-07-21 (×3): qty 0.4

## 2018-07-21 MED ORDER — SUGAMMADEX SODIUM 200 MG/2ML IV SOLN
INTRAVENOUS | Status: DC | PRN
  Administered 2018-07-21: 200 mg via INTRAVENOUS

## 2018-07-21 MED ORDER — ACETAMINOPHEN 650 MG RE SUPP: 650.0000 mg | Freq: Four times a day (QID) | RECTAL | Status: DC | PRN

## 2018-07-21 MED ORDER — HYDROCODONE-ACETAMINOPHEN 7.5-325 MG PO TABS: 1.0000 | ORAL_TABLET | Freq: Once | ORAL | Status: DC | PRN

## 2018-07-21 MED ORDER — PROPOFOL 10 MG/ML IV BOLUS
INTRAVENOUS | Status: AC
  Filled 2018-07-21: qty 20

## 2018-07-21 MED ORDER — ENOXAPARIN SODIUM 40 MG/0.4ML ~~LOC~~ SOLN
40.0000 mg | Freq: Once | SUBCUTANEOUS | Status: AC
  Administered 2018-07-21: 40 mg via SUBCUTANEOUS
  Filled 2018-07-21: qty 0.4

## 2018-07-21 MED ORDER — BUPIVACAINE LIPOSOME 1.3 % IJ SUSP
INTRAMUSCULAR | Status: AC
  Filled 2018-07-21: qty 20

## 2018-07-21 MED ORDER — LACTATED RINGERS IV SOLN
INTRAVENOUS | Status: DC
  Administered 2018-07-21 (×2): via INTRAVENOUS

## 2018-07-21 MED ORDER — ALVIMOPAN 12 MG PO CAPS
12.0000 mg | ORAL_CAPSULE | Freq: Two times a day (BID) | ORAL | Status: DC
  Administered 2018-07-22 – 2018-07-23 (×3): 12 mg via ORAL
  Filled 2018-07-21 (×5): qty 1

## 2018-07-21 MED ORDER — ONDANSETRON HCL 4 MG/2ML IJ SOLN
INTRAMUSCULAR | Status: AC
  Filled 2018-07-21: qty 2

## 2018-07-21 MED ORDER — POVIDONE-IODINE 10 % EX OINT
TOPICAL_OINTMENT | CUTANEOUS | Status: AC
  Filled 2018-07-21: qty 1

## 2018-07-21 MED ORDER — LIDOCAINE 2% (20 MG/ML) 5 ML SYRINGE
INTRAMUSCULAR | Status: DC | PRN
  Administered 2018-07-21: 50 mg via INTRAVENOUS

## 2018-07-21 MED ORDER — ONDANSETRON HCL 4 MG/2ML IJ SOLN
INTRAMUSCULAR | Status: DC | PRN
  Administered 2018-07-21: 4 mg via INTRAVENOUS

## 2018-07-21 MED ORDER — DEXAMETHASONE SODIUM PHOSPHATE 10 MG/ML IJ SOLN
INTRAMUSCULAR | Status: DC | PRN
  Administered 2018-07-21: 4 mg via INTRAVENOUS

## 2018-07-21 SURGICAL SUPPLY — 57 items
BARRIER SKIN 2 3/4 (OSTOMY) IMPLANT
BARRIER SKIN 2 3/4 INCH (OSTOMY)
BARRIER SKIN OD2.25 2 3/4 FLNG (OSTOMY) IMPLANT
BRR SKN FLT 2.75X2.25 2 PC (OSTOMY)
CLAMP POUCH DRAINAGE QUIET (OSTOMY) IMPLANT
COVER LIGHT HANDLE STERIS (MISCELLANEOUS) ×6 IMPLANT
DRSG OPSITE POSTOP 4X10 (GAUZE/BANDAGES/DRESSINGS) IMPLANT
DRSG OPSITE POSTOP 4X8 (GAUZE/BANDAGES/DRESSINGS) ×2 IMPLANT
ELECT REM PT RETURN 9FT ADLT (ELECTROSURGICAL) ×3
ELECTRODE REM PT RTRN 9FT ADLT (ELECTROSURGICAL) ×1 IMPLANT
GLOVE BIO SURGEON STRL SZ 6.5 (GLOVE) ×4 IMPLANT
GLOVE BIO SURGEONS STRL SZ 6.5 (GLOVE) ×2
GLOVE BIOGEL PI IND STRL 6.5 (GLOVE) ×2 IMPLANT
GLOVE BIOGEL PI IND STRL 7.0 (GLOVE) ×5 IMPLANT
GLOVE BIOGEL PI INDICATOR 6.5 (GLOVE) ×4
GLOVE BIOGEL PI INDICATOR 7.0 (GLOVE) ×12
GLOVE ECLIPSE 6.5 STRL STRAW (GLOVE) ×4 IMPLANT
GLOVE SURG SS PI 7.5 STRL IVOR (GLOVE) ×6 IMPLANT
GOWN STRL REUS W/ TWL XL LVL3 (GOWN DISPOSABLE) ×2 IMPLANT
GOWN STRL REUS W/TWL LRG LVL3 (GOWN DISPOSABLE) ×12 IMPLANT
GOWN STRL REUS W/TWL XL LVL3 (GOWN DISPOSABLE) ×6
INST SET MAJOR GENERAL (KITS) ×3 IMPLANT
KIT TURNOVER KIT A (KITS) ×3 IMPLANT
LIGASURE IMPACT 36 18CM CVD LR (INSTRUMENTS) ×3 IMPLANT
MANIFOLD NEPTUNE II (INSTRUMENTS) ×3 IMPLANT
NDL HYPO 18GX1.5 BLUNT FILL (NEEDLE) ×1 IMPLANT
NEEDLE HYPO 18GX1.5 BLUNT FILL (NEEDLE) ×3 IMPLANT
NS IRRIG 1000ML POUR BTL (IV SOLUTION) ×6 IMPLANT
PACK COLON (CUSTOM PROCEDURE TRAY) ×3 IMPLANT
PAD ARMBOARD 7.5X6 YLW CONV (MISCELLANEOUS) ×3 IMPLANT
PENCIL HANDSWITCHING (ELECTRODE) ×3 IMPLANT
POUCH OSTOMY 2 3/4  H 3804 (WOUND CARE)
POUCH OSTOMY 2 3/4 H 3804 (WOUND CARE)
POUCH OSTOMY 2 PC DRNBL 2.25 (WOUND CARE) IMPLANT
POUCH OSTOMY 2 PC DRNBL 2.75 (WOUND CARE) IMPLANT
POUCH OSTOMY DRNBL 2 1/4 (WOUND CARE)
RELOAD LINEAR CUT PROX 55 BLUE (ENDOMECHANICALS) IMPLANT
RELOAD PROXIMATE 75MM BLUE (ENDOMECHANICALS) ×6 IMPLANT
RELOAD STAPLE 55 3.8 BLU REG (ENDOMECHANICALS) IMPLANT
RELOAD STAPLE 75 3.8 BLU REG (ENDOMECHANICALS) IMPLANT
RETRACTOR WND ALEXIS-O 25 LRG (MISCELLANEOUS) IMPLANT
RTRCTR WOUND ALEXIS O 25CM LRG (MISCELLANEOUS) ×3
SPONGE LAP 18X18 X RAY DECT (DISPOSABLE) ×6 IMPLANT
STAPLER GUN LINEAR PROX 60 (STAPLE) ×3 IMPLANT
STAPLER PROXIMATE 55 BLUE (STAPLE) IMPLANT
STAPLER PROXIMATE 75MM BLUE (STAPLE) ×2 IMPLANT
STAPLER VISISTAT (STAPLE) ×3 IMPLANT
SUT CHROMIC 0 SH (SUTURE) IMPLANT
SUT CHROMIC 2 0 SH (SUTURE) IMPLANT
SUT CHROMIC 3 0 SH 27 (SUTURE) IMPLANT
SUT NOVA NAB GS-26 0 60 (SUTURE) IMPLANT
SUT PDS AB 0 CTX 60 (SUTURE) ×4 IMPLANT
SUT SILK 2 0 (SUTURE)
SUT SILK 2-0 18XBRD TIE 12 (SUTURE) IMPLANT
SUT SILK 3 0 SH CR/8 (SUTURE) ×3 IMPLANT
TRAY FOLEY MTR SLVR 16FR STAT (SET/KITS/TRAYS/PACK) ×3 IMPLANT
YANKAUER SUCT BULB TIP 10FT TU (MISCELLANEOUS) ×3 IMPLANT

## 2018-07-21 NOTE — Transfer of Care (Signed)
Immediate Anesthesia Transfer of Care Note  Patient: Melissa Fowler  Procedure(s) Performed: PARTIAL COLECTOMY (N/A Abdomen)  Patient Location: PACU  Anesthesia Type:General  Level of Consciousness: awake, alert  and oriented  Airway & Oxygen Therapy: Patient Spontanous Breathing  Post-op Assessment: Report given to RN and Post -op Vital signs reviewed and stable  Post vital signs: Reviewed and stable  Last Vitals:  Vitals Value Taken Time  BP    Temp    Pulse 103 07/21/2018  9:27 AM  Resp 24 07/21/2018  9:27 AM  SpO2 80 % 07/21/2018  9:27 AM  Vitals shown include unvalidated device data.  Last Pain:  Vitals:   07/21/18 0636  TempSrc: Oral  PainSc: 0-No pain      Patients Stated Pain Goal: 7 (07/21/18 0636)  Complications: No apparent anesthesia complications

## 2018-07-21 NOTE — Anesthesia Postprocedure Evaluation (Signed)
Anesthesia Post Note  Patient: Melissa Fowler  Procedure(s) Performed: PARTIAL COLECTOMY (N/A Abdomen)  Patient location during evaluation: PACU Anesthesia Type: General Level of consciousness: awake and alert Pain management: pain level controlled Vital Signs Assessment: post-procedure vital signs reviewed and stable Respiratory status: spontaneous breathing Cardiovascular status: blood pressure returned to baseline Postop Assessment: no apparent nausea or vomiting Anesthetic complications: no     Last Vitals:  Vitals:   07/21/18 1000 07/21/18 1015  BP: 120/74 119/78  Pulse: 82 83  Resp: 17 18  Temp:    SpO2: 96% 95%    Last Pain:  Vitals:   07/21/18 1000  TempSrc:   PainSc: Asleep                 Kushal Saunders

## 2018-07-21 NOTE — Interval H&P Note (Signed)
History and Physical Interval Note:  07/21/2018 7:03 AM  Melissa Fowler  has presented today for surgery, with the diagnosis of colovaginal fistula  The various methods of treatment have been discussed with the patient and family. After consideration of risks, benefits and other options for treatment, the patient has consented to  Procedure(s): PARTIAL COLECTOMY (N/A) as a surgical intervention .  The patient's history has been reviewed, patient examined, no change in status, stable for surgery.  I have reviewed the patient's chart and labs.  Questions were answered to the patient's satisfaction.     [name]

## 2018-07-21 NOTE — Op Note (Signed)
Patient:  Melissa Fowler  DOB:  August 17, 1966  MRN:  [id number]   Preop Diagnosis: Colovaginal fistula  Postop Diagnosis: Same  Procedure: Partial colectomy  Surgeon: [name], MD  Assistant: Larae Grooms, MD  Anes: General endotracheal  Indications: Patient is a 52 year old white female who was recently diagnosed with a colovaginal fistula.  This is secondary to diverticulitis.  The risks and benefits of the procedure including bleeding, infection, anastomotic leak, recurrence of the fistula, and the possibility of a colostomy were fully explained to the patient, who gave informed consent.  Procedure note: The patient was placed in the lithotomy position after induction of general endotracheal anesthesia.  The abdomen and perineum were prepped and draped using the usual sterile technique with DuraPrep and Betadine.  Surgical site confirmation was performed.  A lower midline incision was made into the peritoneum without difficulty.  An Alexis wound protector was placed.  Dissection was begun in the sigmoid colon region.  The peritoneum was incised.  Care was taken to avoid the left ureter.  The patient is status post a left oophorectomy.  She is also had a hysterectomy in the past.  The right ovary was within normal limits.  Adhesions of the distal sigmoid colon were taken off the bladder sharply.  The bladder was not injured during this process.  The distal sigmoid colon was attached to the left vaginal cuff.  There was granulation tissue present.  This was incised and separated.  A GIA stapler was placed across the mid sigmoid colon and fired.  This was likewise done past the area of inflammation of the sigmoid colon.  The mesentery was then divided using the LigaSure.  A suture was placed proximally for orientation purposes on the specimen.  The specimen was then passed off.  The vaginal cuff area was irrigated with normal saline.  Any granulation tissue was cauterized.  No frank opening  was noted.  A side-to-side colocolostomy was then performed using a GIA-75 stapler.  The colotomy was closed using a TA 60 stapler.  Staple line was bolstered using 3-0 silk sutures.  When the bowel was returned into the pelvis, the anastomosis was away from the area of involvement of the vaginal cuff.  Adipose tissue was in the mesenteric area which covered the vaginal cuff.  The pelvis was then copiously irrigated with normal saline.  All operating personnel then changed her gown and gloves.  A new set-up was used for closure.  The fascia was reapproximated using a looped 0 PDS running suture.  The subcutaneous layer was irrigated with normal saline.  Exparel was instilled into the surrounding wound.  The skin was closed using staples.  Betadine ointment and dry sterile dressing were applied.  All tape and needle counts were correct at the end of the procedure.  Patient was extubated and transferred to PACU in stable condition.  Complications: None  EBL: 100 cc  Specimen: Sigmoid colon, suture proximal

## 2018-07-21 NOTE — Anesthesia Preprocedure Evaluation (Signed)
Anesthesia Evaluation  Patient identified by MRN, date of birth, ID band Patient awake    Reviewed: Allergy & Precautions, H&P , NPO status , Patient's Chart, lab work & pertinent test results  Airway Mallampati: II  TM Distance: >3 FB Neck ROM: full    Dental  (+) Edentulous Upper, Edentulous Lower   Pulmonary neg pulmonary ROS, Current Smoker,    Pulmonary exam normal breath sounds clear to auscultation       Cardiovascular Exercise Tolerance: Good negative cardio ROS   Rhythm:regular Rate:Normal     Neuro/Psych Depression negative neurological ROS  negative psych ROS   GI/Hepatic Neg liver ROS, GERD  ,  Endo/Other  negative endocrine ROS  Renal/GU negative Renal ROS  negative genitourinary   Musculoskeletal   Abdominal   Peds  Hematology negative hematology ROS (+)   Anesthesia Other Findings   Reproductive/Obstetrics negative OB ROS                             Anesthesia Physical Anesthesia Plan  ASA: II  Anesthesia Plan: General   Post-op Pain Management:    Induction:   PONV Risk Score and Plan:   Airway Management Planned:   Additional Equipment:   Intra-op Plan:   Post-operative Plan:   Informed Consent: I have reviewed the patients History and Physical, chart, labs and discussed the procedure including the risks, benefits and alternatives for the proposed anesthesia with the patient or authorized representative who has indicated his/her understanding and acceptance.   Dental Advisory Given  Plan Discussed with: CRNA  Anesthesia Plan Comments:         Anesthesia Quick Evaluation

## 2018-07-21 NOTE — Anesthesia Procedure Notes (Signed)
Procedure Name: Intubation Date/Time: 07/21/2018 7:44 AM Performed by: Talbot Grumbling, CRNA Pre-anesthesia Checklist: Patient identified, Emergency Drugs available, Suction available and Patient being monitored Patient Re-evaluated:Patient Re-evaluated prior to induction Oxygen Delivery Method: Circle system utilized Preoxygenation: Pre-oxygenation with 100% oxygen Induction Type: IV induction Ventilation: Mask ventilation without difficulty Laryngoscope Size: Mac and 3 Grade View: Grade I Tube type: Oral Tube size: 7.0 mm Number of attempts: 1 Airway Equipment and Method: Stylet Placement Confirmation: ETT inserted through vocal cords under direct vision,  positive ETCO2 and breath sounds checked- equal and bilateral Secured at: 21 cm Tube secured with: Tape Dental Injury: Teeth and Oropharynx as per pre-operative assessment

## 2018-07-22 ENCOUNTER — Encounter (HOSPITAL_COMMUNITY): Payer: Self-pay | Admitting: General Surgery

## 2018-07-22 LAB — CBC
HCT: 31.7 % — ABNORMAL LOW (ref 36.0–46.0)
HEMOGLOBIN: 10 g/dL — AB (ref 12.0–15.0)
MCH: 30.7 pg (ref 26.0–34.0)
MCHC: 31.5 g/dL (ref 30.0–36.0)
MCV: 97.2 fL (ref 80.0–100.0)
Platelets: 333 10*3/uL (ref 150–400)
RBC: 3.26 MIL/uL — AB (ref 3.87–5.11)
RDW: 13.6 % (ref 11.5–15.5)
WBC: 9.8 10*3/uL (ref 4.0–10.5)
nRBC: 0 % (ref 0.0–0.2)

## 2018-07-22 LAB — BASIC METABOLIC PANEL
Anion gap: 8 (ref 5–15)
BUN: 7 mg/dL (ref 6–20)
CO2: 21 mmol/L — ABNORMAL LOW (ref 22–32)
CREATININE: 0.38 mg/dL — AB (ref 0.44–1.00)
Calcium: 8.3 mg/dL — ABNORMAL LOW (ref 8.9–10.3)
Chloride: 109 mmol/L (ref 98–111)
GFR calc Af Amer: >60 mL/min (ref >60)
GFR calc non Af Amer: >60 mL/min (ref >60)
GLUCOSE: 103 mg/dL — AB (ref 70–99)
POTASSIUM: 3.3 mmol/L — AB (ref 3.5–5.1)
Sodium: 138 mmol/L (ref 135–145)

## 2018-07-22 LAB — PHOSPHORUS: PHOSPHORUS: 2.9 mg/dL (ref 2.5–4.6)

## 2018-07-22 LAB — MAGNESIUM: Magnesium: 1.9 mg/dL (ref 1.7–2.4)

## 2018-07-22 MED ORDER — KCL IN DEXTROSE-NACL 40-5-0.45 MEQ/L-%-% IV SOLN
INTRAVENOUS | Status: DC
  Administered 2018-07-22 – 2018-07-24 (×2): via INTRAVENOUS

## 2018-07-22 NOTE — Progress Notes (Signed)
1 Day Post-Op  Subjective: Patient having abdominal gas pains.  It is controlled with pain medication.  No nausea or vomiting is noted.  Objective: Vital signs in last 24 hours: Temp:  [98 F (36.7 C)-98.9 F (37.2 C)] 98.3 F (36.8 C) (11/05 0625) Pulse Rate:  [80-93] 80 (11/05 0625) Resp:  [9-20] 20 (11/05 0625) BP: (104-126)/(69-91) 116/91 (11/05 0625) SpO2:  [92 %-97 %] 93 % (11/05 0625) Last BM Date: 07/21/18  Intake/Output from previous day: 11/04 0701 - 11/05 0700 In: 3564.3 [P.O.:480; I.V.:2984.3; IV Piggyback:100] Out: 1650 [Urine:1550; Blood:100] Intake/Output this shift: No intake/output data recorded.  General appearance: alert, cooperative and no distress Resp: clear to auscultation bilaterally Cardio: regular rate and rhythm, S1, S2 normal, no murmur, click, rub or gallop GI: Soft, occasional bowel sounds appreciated.  Incision healing well.  Lab Results:  Recent Labs    07/22/18 0418  WBC 9.8  HGB 10.0*  HCT 31.7*  PLT 333   BMET Recent Labs    07/22/18 0418  NA 138  K 3.3*  CL 109  CO2 21*  GLUCOSE 103*  BUN 7  CREATININE 0.38*  CALCIUM 8.3*   PT/INR No results for input(s): LABPROT, INR in the last 72 hours.  Studies/Results: No results found.  Anti-infectives: Anti-infectives (From admission, onward)   Start     Dose/Rate Route Frequency Ordered Stop   07/21/18 0615  cefoTEtan (CEFOTAN) 2 g in sodium chloride 0.9 % 100 mL IVPB     2 g 200 mL/hr over 30 Minutes Intravenous On call to O.R. 07/21/18 1610 07/21/18 0815      Assessment/Plan: s/p Procedure(s): PARTIAL COLECTOMY Impression: Stable on postoperative day 1.  Mild hypokalemia will be addressed.  Continue current management.  LOS: 1 day    [name] 07/22/2018

## 2018-07-23 LAB — BASIC METABOLIC PANEL
Anion gap: 9 (ref 5–15)
BUN: 6 mg/dL (ref 6–20)
CHLORIDE: 102 mmol/L (ref 98–111)
CO2: 25 mmol/L (ref 22–32)
Calcium: 8.7 mg/dL — ABNORMAL LOW (ref 8.9–10.3)
Creatinine, Ser: 0.4 mg/dL — ABNORMAL LOW (ref 0.44–1.00)
GFR calc Af Amer: >60 mL/min (ref >60)
GFR calc non Af Amer: >60 mL/min (ref >60)
Glucose, Bld: 112 mg/dL — ABNORMAL HIGH (ref 70–99)
POTASSIUM: 3.6 mmol/L (ref 3.5–5.1)
SODIUM: 136 mmol/L (ref 135–145)

## 2018-07-23 LAB — CBC
HCT: 32.5 % — ABNORMAL LOW (ref 36.0–46.0)
Hemoglobin: 10.3 g/dL — ABNORMAL LOW (ref 12.0–15.0)
MCH: 31 pg (ref 26.0–34.0)
MCHC: 31.7 g/dL (ref 30.0–36.0)
MCV: 97.9 fL (ref 80.0–100.0)
Platelets: 350 10*3/uL (ref 150–400)
RBC: 3.32 MIL/uL — AB (ref 3.87–5.11)
RDW: 13.7 % (ref 11.5–15.5)
WBC: 11.1 10*3/uL — ABNORMAL HIGH (ref 4.0–10.5)
nRBC: 0 % (ref 0.0–0.2)

## 2018-07-23 LAB — PHOSPHORUS: Phosphorus: 3 mg/dL (ref 2.5–4.6)

## 2018-07-23 LAB — MAGNESIUM: MAGNESIUM: 1.9 mg/dL (ref 1.7–2.4)

## 2018-07-23 NOTE — Progress Notes (Signed)
2 Days Post-Op  Subjective: Patient still with crampy abdominal pain.  No flatus or bowel movement yet.  Objective: Vital signs in last 24 hours: Temp:  [97.8 F (36.6 C)-98.5 F (36.9 C)] 98.5 F (36.9 C) (11/06 0618) Pulse Rate:  [89-102] 102 (11/06 0618) Resp:  [18-19] 19 (11/06 0618) BP: (119-127)/(80-83) 121/80 (11/06 0618) SpO2:  [88 %-93 %] 88 % (11/06 0618) Last BM Date: 07/21/18  Intake/Output from previous day: 11/05 0701 - 11/06 0700 In: 1394.1 [P.O.:720; I.V.:674.1] Out: 1550 [Urine:1550] Intake/Output this shift: No intake/output data recorded.  General appearance: alert, cooperative and no distress Resp: clear to auscultation bilaterally Cardio: regular rate and rhythm, S1, S2 normal, no murmur, click, rub or gallop GI: Soft with minimal bowel sounds appreciated.  Incision healing well.  Lab Results:  Recent Labs    07/22/18 0418 07/23/18 0442  WBC 9.8 11.1*  HGB 10.0* 10.3*  HCT 31.7* 32.5*  PLT 333 350   BMET Recent Labs    07/22/18 0418 07/23/18 0442  NA 138 136  K 3.3* 3.6  CL 109 102  CO2 21* 25  GLUCOSE 103* 112*  BUN 7 6  CREATININE 0.38* 0.40*  CALCIUM 8.3* 8.7*   PT/INR No results for input(s): LABPROT, INR in the last 72 hours.  Studies/Results: No results found.  Anti-infectives: Anti-infectives (From admission, onward)   Start     Dose/Rate Route Frequency Ordered Stop   07/21/18 0615  cefoTEtan (CEFOTAN) 2 g in sodium chloride 0.9 % 100 mL IVPB     2 g 200 mL/hr over 30 Minutes Intravenous On call to O.R. 07/21/18 1914 07/21/18 0815      Assessment/Plan: s/p Procedure(s): PARTIAL COLECTOMY Impression: Stable on postoperative day 2.  Awaiting return of bowel function.  Hypokalemia resolved.  Will encourage ambulation.  LOS: 2 days    [name] 07/23/2018

## 2018-07-24 NOTE — Progress Notes (Signed)
3 Days Post-Op  Subjective: Patient has less abdominal cramping.  She did have a bowel movement.  Objective: Vital signs in last 24 hours: Temp:  [98.8 F (37.1 C)-99 F (37.2 C)] 98.8 F (37.1 C) (11/07 0516) Pulse Rate:  [82-92] 82 (11/07 0516) Resp:  [17-18] 17 (11/07 0516) BP: (102-111)/(66-80) 109/72 (11/07 0516) SpO2:  [88 %-97 %] 97 % (11/07 0516) Last BM Date: 07/23/18  Intake/Output from previous day: 11/06 0701 - 11/07 0700 In: 1291.7 [P.O.:240; I.V.:1051.7] Out: 1000 [Urine:1000] Intake/Output this shift: No intake/output data recorded.  General appearance: alert, cooperative and no distress Resp: clear to auscultation bilaterally Cardio: regular rate and rhythm, S1, S2 normal, no murmur, click, rub or gallop GI: Soft, incision healing well.  Bowel sounds active.  Lab Results:  Recent Labs    07/22/18 0418 07/23/18 0442  WBC 9.8 11.1*  HGB 10.0* 10.3*  HCT 31.7* 32.5*  PLT 333 350   BMET Recent Labs    07/22/18 0418 07/23/18 0442  NA 138 136  K 3.3* 3.6  CL 109 102  CO2 21* 25  GLUCOSE 103* 112*  BUN 7 6  CREATININE 0.38* 0.40*  CALCIUM 8.3* 8.7*   PT/INR No results for input(s): LABPROT, INR in the last 72 hours.  Studies/Results: No results found.  Anti-infectives: Anti-infectives (From admission, onward)   Start     Dose/Rate Route Frequency Ordered Stop   07/21/18 0615  cefoTEtan (CEFOTAN) 2 g in sodium chloride 0.9 % 100 mL IVPB     2 g 200 mL/hr over 30 Minutes Intravenous On call to O.R. 07/21/18 [id number] 07/21/18 0815      Assessment/Plan: s/p Procedure(s): PARTIAL COLECTOMY Impression: Progressing well on postoperative day 3.  Bowel function has returned.  Will advance diet.  Anticipate discharge in next 24 to 48 hours.  LOS: 3 days    Melissa Fowler 07/24/2018

## 2018-07-25 MED ORDER — TRAMADOL HCL 50 MG PO TABS: 50.0000 mg | ORAL_TABLET | Freq: Four times a day (QID) | ORAL | 0 refills | Status: DC | PRN

## 2018-07-25 NOTE — Discharge Summary (Signed)
Physician Discharge Summary  Patient ID: Melissa Fowler MRN: [id number] DOB/AGE: Jan 30, 1966 52 y.o.  Admit date: 07/21/2018 Discharge date: 07/25/2018  Admission Diagnoses: Colovaginal fistula  Discharge Diagnoses: Same Active Problems:   Colovaginal fistula   S/P partial colectomy   Discharged Condition: good  Hospital Course: Patient is a 52 year old white female with a history of colovaginal fistula who underwent a partial colectomy on 07/21/2018.  She tolerated the surgery well.  Her postoperative course unremarkable.  Her diet was advanced without difficulty once her bowel function returned.  Final pathology showed residual diverticulitis.  No malignancy was seen.  She is being discharged home on 07/25/2018 in good and improving condition.  Treatments: surgery: Partial colectomy on 07/21/2018  Discharge Exam: Blood pressure 121/82, pulse 94, temperature 98.2 F (36.8 C), temperature source Oral, resp. rate 18, height 5\' 5"  (1.651 m), weight 77.1 kg, SpO2 91 %. General appearance: alert, cooperative and no distress Resp: clear to auscultation bilaterally Cardio: regular rate and rhythm, S1, S2 normal, no murmur, click, rub or gallop GI: Soft, incision healing well.  Active bowel sounds.  Disposition: Discharge disposition: 01-Home or Self Care       Discharge Instructions    Diet - low sodium heart healthy   Complete by:  As directed    Increase activity slowly   Complete by:  As directed      Allergies as of 07/25/2018      Reactions   Codeine Itching   Cyclobenzaprine Hcl Other (See Comments)   Hallucinations   Morphine And Related Other (See Comments)   Chest tightness   Nsaids Other (See Comments)   Hallucinations   Metronidazole Rash      Medication List    STOP taking these medications   GOODYS EXTRA STRENGTH PO     TAKE these medications   colestipol 1 g tablet Commonly known as:  COLESTID Take 2 tablets (2 g total) by mouth daily. If develop  constipation, hold for couple of days and restart at 1 tablet daily. What changed:    how much to take  additional instructions   diphenhydramine-acetaminophen 25-500 MG Tabs tablet Commonly known as:  TYLENOL PM Take 2 tablets by mouth at bedtime as needed (for sleep).   neomycin 500 MG tablet Commonly known as:  MYCIFRADIN Take 2 tablets (1,000 mg total) by mouth 3 (three) times daily. Take two tablets by mouth at 1pm, 2pm, and 9pm the day before surgery What changed:    when to take this  additional instructions   traMADol 50 MG tablet Commonly known as:  ULTRAM Take 1 tablet (50 mg total) by mouth every 6 (six) hours as needed.      Follow-up Information    [name], MD. Schedule an appointment as soon as possible for a visit on 07/31/2018.   Specialty:  General Surgery Contact information: [address] [place] [address] [phone]           Signed: [name] 07/25/2018, 7:32 AM

## 2018-07-25 NOTE — Discharge Instructions (Signed)
Open Colectomy, Care After °This sheet gives you information about how to care for yourself after your procedure. Your health care provider may also give you more specific instructions. If you have problems or questions, contact your health care provider. °What can I expect after the procedure? °After the procedure, it is common to have: °· Pain in your abdomen, especially along your incision. °· Tiredness. Your energy level will return to normal over the next several weeks. °· Constipation. °· Nausea. °· Difficulty urinating. ° °Follow these instructions at home: °Activity °· You may be able to return to most of your normal activities within 1-2 weeks, such as working, walking up stairs, and sexual activity. °· Avoid activities that require a lot of energy for 4-6 weeks after surgery, such as running, climbing, and lifting heavy objects. Ask your health care provider what activities are safe for you. °· Take rest breaks during the day as needed. °· Do not drive for 1-2 weeks or until your health care provider says that it is safe. °· Do not drive or use heavy machinery while taking prescription pain medicines. °· Do not lift anything that is heavier than 10 lb (4.3 kg) until your health care provider says that it is safe. °Incision care °· Follow instructions from your health care provider about how to take care of your incision. Make sure you: °? Wash your hands with soap and water before you change your bandage (dressing). If soap and water are not available, use hand sanitizer. °? Change your dressing as told by your health care provider. °? Leave stitches (sutures) or staples in place. These skin closures may need to stay in place for 2 weeks or longer. °· Avoid wearing tight clothing around your incision. °· Protect your incision area from the sun. °· Check your incision area every day for signs of infection. Check for: °? More redness, swelling, or pain. °? More fluid or blood. °? Warmth. °? Pus or a bad  smell. °General instructions °· Do not take baths, swim, or use a hot tub until your health care provider approves. Ask your health care provider when you may shower. °· Take over-the-counter and prescription medicines, including stool softeners, only as told by your health care provider. °· Eat a low-fat and low-fiber diet for the first 4 weeks after surgery. °· Keep all follow-up visits as told by your health care provider. This is important. °Contact a health care provider if: °· You have more redness, swelling, or pain around your incision. °· You have more fluid or blood coming from your incision. °· Your incision feels warm to the touch. °· You have pus or a bad smell coming from your incision. °· You have a fever or chills. °· You do not have a bowel movement 2-3 days after surgery. °· You cannot eat or drink for 24 hours or more. °· You have persistent nausea and vomiting. °· You have abdominal pain that gets worse and does not get better with medicine. °Get help right away if: °· You have chest pain. °· You have shortness of breath. °· You have pain or swelling in your legs. °· Your incision breaks open after your sutures or staples have been removed. °· You have bleeding from the rectum. °This information is not intended to replace advice given to you by your health care provider. Make sure you discuss any questions you have with your health care provider. °Document Released: 03/27/2011 Document Revised: 06/04/2016 Document Reviewed: 06/04/2016 °Elsevier Interactive Patient   Education © 2018 [employer] ° °

## 2018-07-25 NOTE — Progress Notes (Signed)
Removed IV-clean, dry, intact. Reviewed d/c paperwork with patient and daughter. Answered all questions. Wheeled stable patient and belongings to main entrance where she was picked up by daughter.

## 2018-07-31 ENCOUNTER — Ambulatory Visit (INDEPENDENT_AMBULATORY_CARE_PROVIDER_SITE_OTHER): Payer: Self-pay | Admitting: General Surgery

## 2018-07-31 ENCOUNTER — Encounter: Payer: Self-pay | Admitting: General Surgery

## 2018-07-31 VITALS — BP 117/89 | HR 91 | Temp 98.6°F | Resp 16 | Wt 168.0 lb

## 2018-07-31 DIAGNOSIS — Z09 Encounter for follow-up examination after completed treatment for conditions other than malignant neoplasm: Secondary | ICD-10-CM

## 2018-07-31 MED ORDER — TRAMADOL HCL 50 MG PO TABS: 50.0000 mg | ORAL_TABLET | Freq: Four times a day (QID) | ORAL | 0 refills | Status: DC | PRN

## 2018-07-31 NOTE — Progress Notes (Signed)
Subjective:     Melissa Fowler S Hinkley  Here for postoperative visit.  Patient having moderate incisional pain.  She is having normal bowel movements.  Her appetite is fair.  She is eating a regular diet.  She has not had any significant vaginal discharge this week.  She is pleased with the results of surgery. Objective:    BP 117/89 (BP Location: Left Arm, Patient Position: Sitting, Cuff Size: Normal)   Pulse 91   Temp 98.6 F (37 C) (Temporal)   Resp 16   Wt 168 lb (76.2 kg)   BMI 27.96 kg/m   General:  alert, cooperative and no distress  Abdomen soft, incision healing well.  Staples removed, Steri-Strips applied. Final pathology consistent with diagnosis.     Assessment:    Doing well postoperatively.    Plan:   Have reordered her Ultram.  May increase activity as able.  Follow-up here in 2 weeks.

## 2018-08-12 ENCOUNTER — Ambulatory Visit (INDEPENDENT_AMBULATORY_CARE_PROVIDER_SITE_OTHER): Payer: Self-pay | Admitting: General Surgery

## 2018-08-12 ENCOUNTER — Encounter: Payer: Self-pay | Admitting: General Surgery

## 2018-08-12 VITALS — BP 127/79 | HR 104 | Temp 98.2°F | Resp 18 | Wt 164.2 lb

## 2018-08-12 DIAGNOSIS — Z09 Encounter for follow-up examination after completed treatment for conditions other than malignant neoplasm: Secondary | ICD-10-CM

## 2018-08-12 NOTE — Progress Notes (Signed)
Subjective:     Melissa Fowler  Patient presents back with an episode of some drainage from her vagina.  She was instructed to do a vaginal douche, and this did resolve.  She is not passing any stool or air per vagina.  She is pleased with the results. Objective:    BP 127/79 (BP Location: Left Arm, Patient Position: Sitting, Cuff Size: Normal)   Pulse (!) 104   Temp 98.2 F (36.8 C) (Temporal)   Resp 18   Wt 164 lb 3.2 oz (74.5 kg)   BMI 27.32 kg/m   General:  alert, cooperative and no distress  Abdomen soft, incision healing well.  Nontender, nondistended.     Assessment:    Doing well postoperatively.   Vaginal discharge resolved.   Plan:   Follow-up as needed.

## 2018-10-08 IMAGING — CT CT ABD-PELV W/ CM
2 of 5 series · 15 of 46 positions shown, 17 images · IV contrast (iopamidol)
Comparison: None.

CLINICAL DATA: Suspect entero or colo vaginal fistula or
colovaginal fistula.

EXAM:
CT ABDOMEN AND PELVIS WITH CONTRAST
TECHNIQUE: Multidetector CT imaging of the abdomen and pelvis was performed
using the standard protocol following bolus administration of
intravenous contrast.
CONTRAST:  100mL ESP24D-DTT IOPAMIDOL (ESP24D-DTT) INJECTION 61%

[Series 2: axial st · axial · 0.82mm/px · z∈[-429,-44]mm · 12 of 91 slices shown, 14 images]
[im 7/91  soft-tissue]
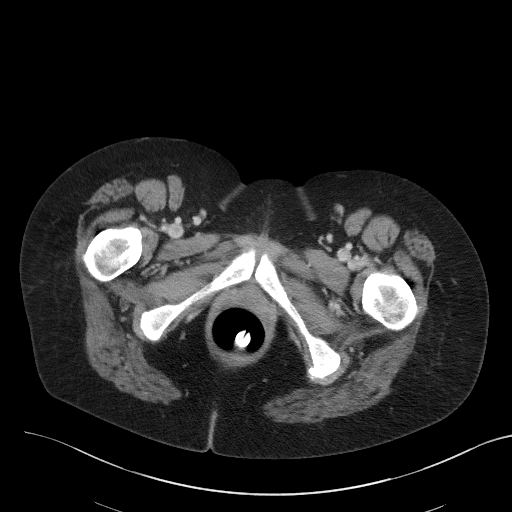
[im 7/91  bone]
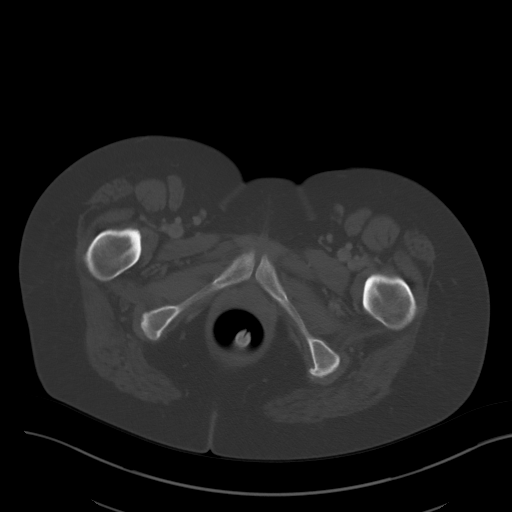
[im 13/91  soft-tissue]
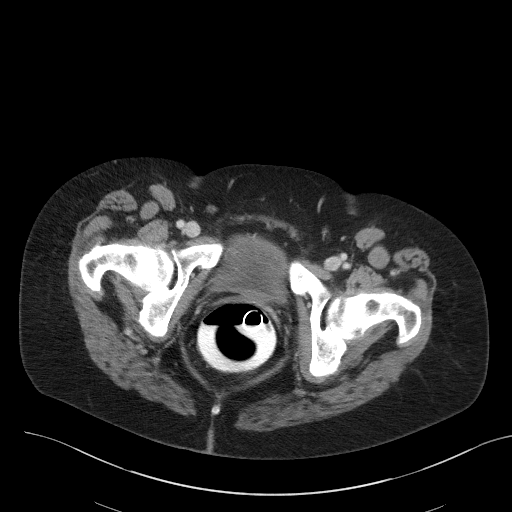
[im 20/91  soft-tissue]
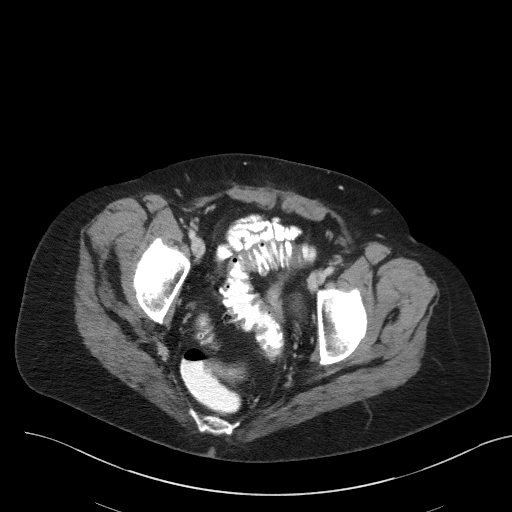
[im 26/91  soft-tissue]
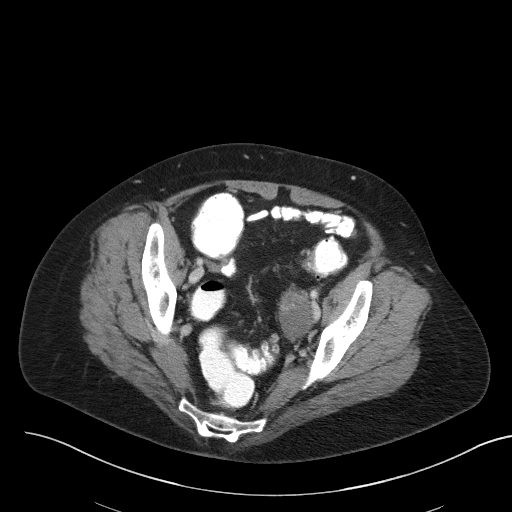
[im 33/91  soft-tissue]
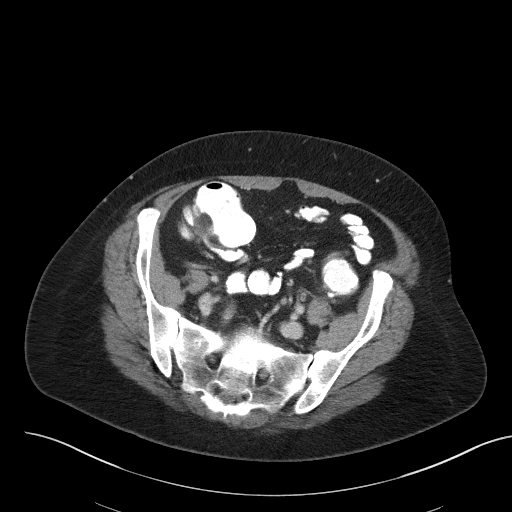
[im 39/91  soft-tissue]
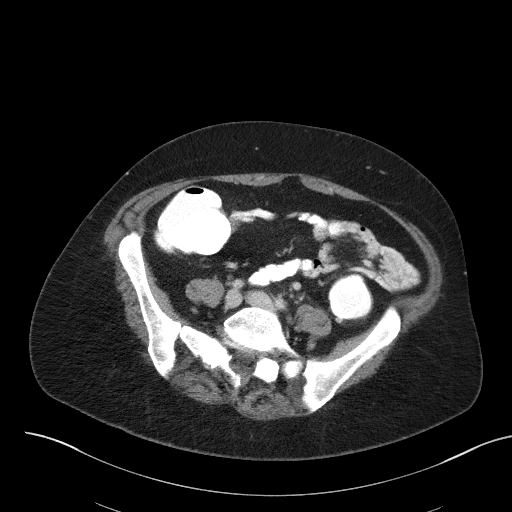
[im 52/91  soft-tissue]
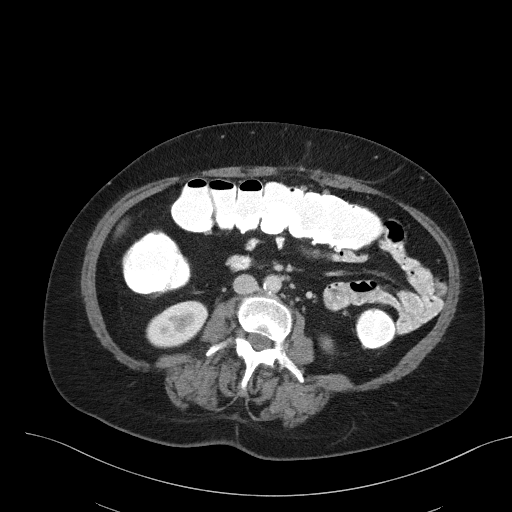
[im 58/91  soft-tissue]
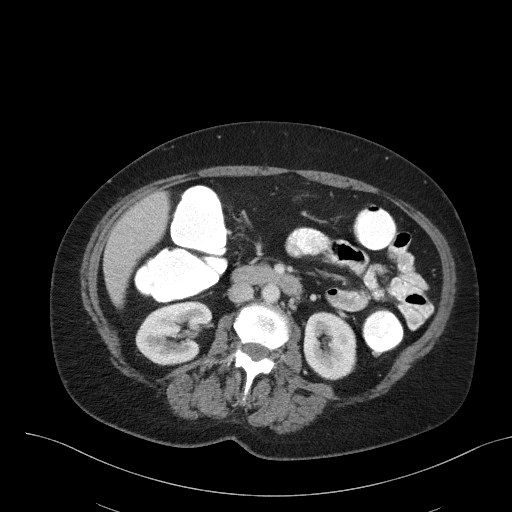
[im 65/91  soft-tissue]
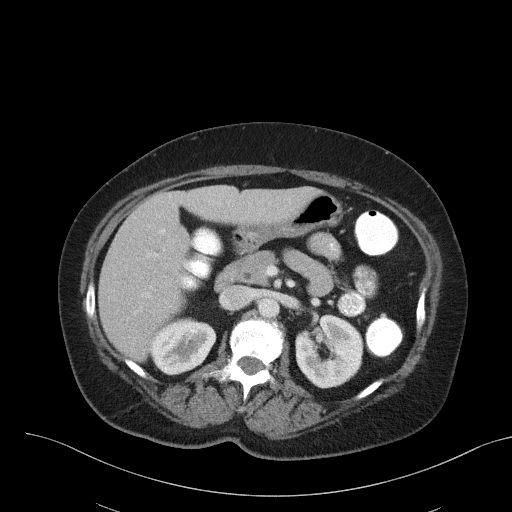
[im 65/91  bone]
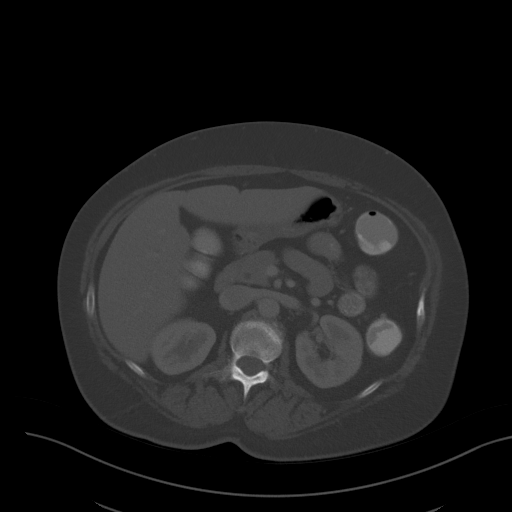
[im 71/91  soft-tissue]
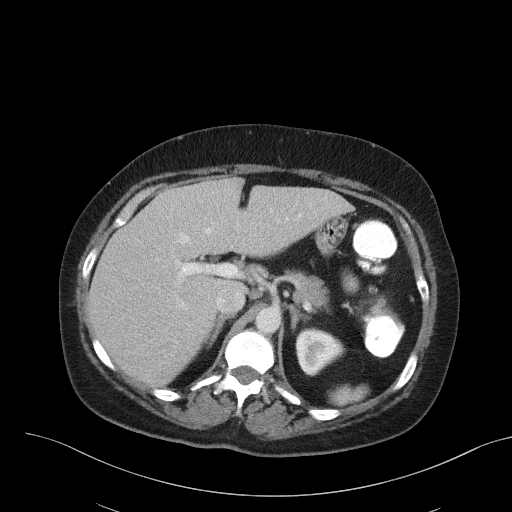
[im 78/91  soft-tissue]
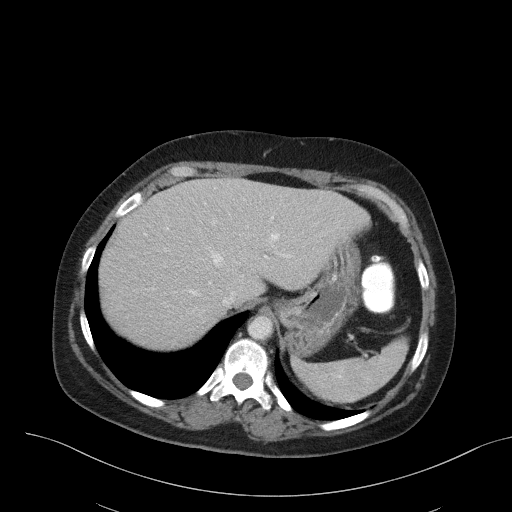
[im 84/91  soft-tissue]
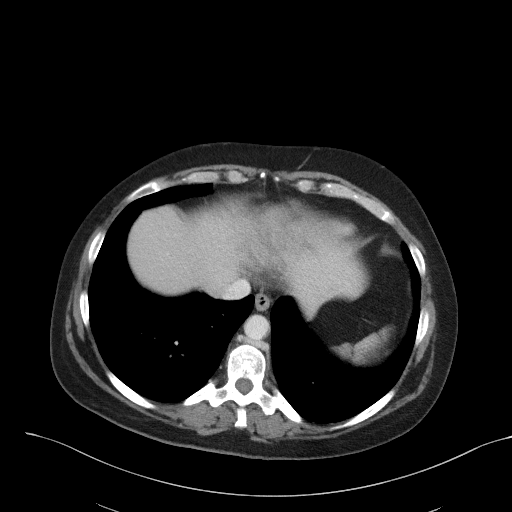

[Series 4: coronal st · coronal · 0.70mm/px · 3 of 97 slices shown]
[im 33/97  soft-tissue]
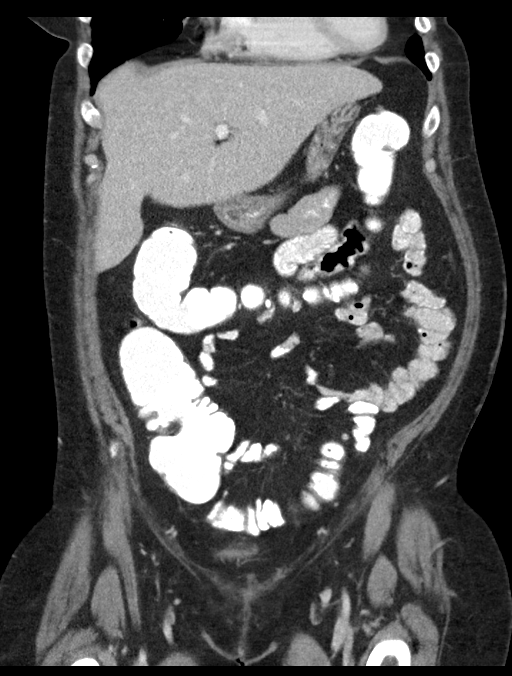
[im 43/97  soft-tissue]
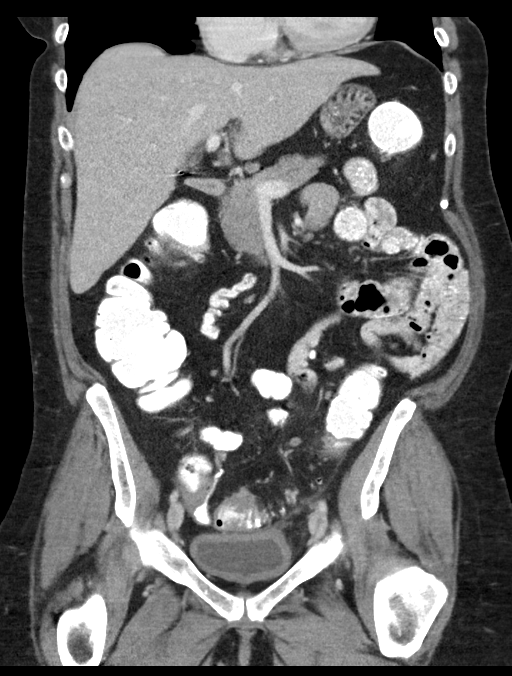
[im 54/97  soft-tissue]
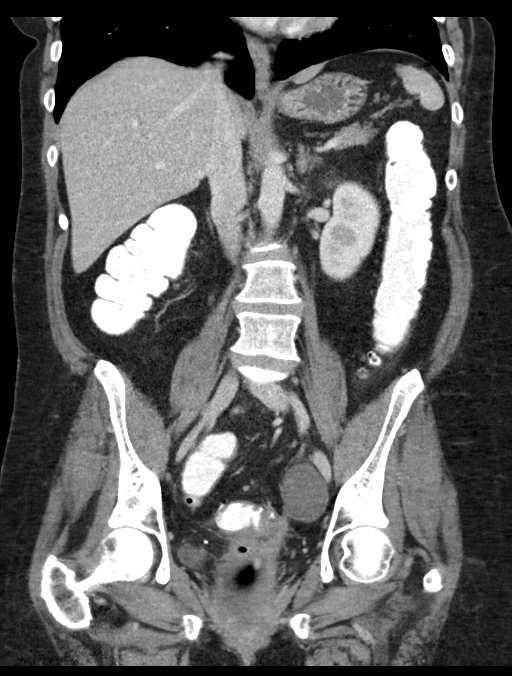

[15 of 46 positions shown; findings below may reference images not displayed]

FINDINGS: Lower chest: No acute abnormality.

Hepatobiliary: No focal liver abnormality is seen. No gallstones,
gallbladder wall thickening, or biliary dilatation.

Pancreas: Unremarkable. No pancreatic ductal dilatation or
surrounding inflammatory changes.

Spleen: Normal in size without focal abnormality.

Adrenals/Urinary Tract: Normal adrenal glands.

Normal appearance of the kidneys. No mass or hydronephrosis. No
intraluminal bladder abnormality identified. Posterior bladder dome
appears adherent to the wall of anterior vagina and vaginal cuff,
image 64/7. On the delayed images there is contrast opacification of
the urinary bladder without definite contrast opacified fistula
between the vagina and bladder.

Stomach/Bowel: The stomach appears normal. The small bowel loops
have a normal course and caliber. The appendix is visualized and
appears normal. Scattered colonic diverticula are identified. There
is wall thickening without surrounding inflammation involving the
sigmoid colon compatible with chronic diverticular disease. Arising
from the posterior wall of the proximal sigmoid colon is a linear
area of soft tissue which communicates with the left adnexa, image
56/7, and continues to the left side of vaginal cuff, image number
60/7. This appears hyperdense and in light of the patient's clinical
presentation is suspicious for fistula. Arising from the distal
sigmoid colon is an area closely associated with the posterior
aspect of the left side of vaginal cuff, image 67/5 which is also
concerning for fistula.

Vascular/Lymphatic: Aortic atherosclerosis without aneurysm. No
abdominopelvic adenopathy identified.

Reproductive: Status post hysterectomy. The left side of vaginal
cuff where there is suspected communication with the proximal and
distal sigmoid colon appears hyperdense measuring up to 122 HU. This
is suspicious for the presence of enteric contrast material
secondary to patent fistulous communication with the sigmoid colon.
Left ovary cyst measures 3.6 cm, image 56/7. Normal appearance of
the right ovary.

Other: No free fluid.

Musculoskeletal: No acute or significant osseous findings.
IMPRESSION: 1. Chronic diverticular disease involves the sigmoid colon. Suspect
fistulous communication between the proximal sigmoid colon, left
adnexa and left side of vaginal cuff. A second area of suspected
fistula between the distal sigmoid colon and left side of vaginal
cuff also noted.
2.  Aortic Atherosclerosis (OU6FU-ZV0.0).
